# Patient Record
Sex: Female | Born: 1960
Health system: Southern US, Community
[De-identification: ages and names within clinical notes are randomized; demographics above are authoritative.]

## PROBLEM LIST (undated history)

## (undated) DIAGNOSIS — R112 Nausea with vomiting, unspecified: Secondary | ICD-10-CM

## (undated) DIAGNOSIS — Z9889 Other specified postprocedural states: Secondary | ICD-10-CM

## (undated) DIAGNOSIS — D649 Anemia, unspecified: Secondary | ICD-10-CM

## (undated) DIAGNOSIS — E538 Deficiency of other specified B group vitamins: Secondary | ICD-10-CM

## (undated) DIAGNOSIS — M1990 Unspecified osteoarthritis, unspecified site: Secondary | ICD-10-CM

## (undated) DIAGNOSIS — N200 Calculus of kidney: Secondary | ICD-10-CM

## (undated) HISTORY — PX: ANTERIOR FUSION CERVICAL SPINE: SUR626

## (undated) HISTORY — DX: Anemia, unspecified: D64.9

## (undated) HISTORY — PX: INCISION AND DRAINAGE / EXCISION THYROGLOSSAL CYST: SUR667

## (undated) HISTORY — PX: THYROGLOSSAL DUCT CYST: SHX297

---

## 2003-01-18 ENCOUNTER — Encounter: Admission: RE | Admit: 2003-01-18 | Discharge: 2003-04-18 | Payer: Self-pay | Admitting: Orthopedic Surgery

## 2004-08-22 ENCOUNTER — Ambulatory Visit: Payer: Self-pay | Admitting: Internal Medicine

## 2004-10-04 ENCOUNTER — Ambulatory Visit (HOSPITAL_COMMUNITY): Admission: RE | Admit: 2004-10-04 | Discharge: 2004-10-04 | Payer: Self-pay | Admitting: Neurosurgery

## 2004-11-09 ENCOUNTER — Ambulatory Visit (HOSPITAL_COMMUNITY): Admission: RE | Admit: 2004-11-09 | Discharge: 2004-11-10 | Payer: Self-pay | Admitting: Neurosurgery

## 2005-09-05 ENCOUNTER — Ambulatory Visit: Payer: Self-pay | Admitting: Internal Medicine

## 2012-10-01 HISTORY — PX: OTHER SURGICAL HISTORY: SHX169

## 2012-10-11 ENCOUNTER — Emergency Department (HOSPITAL_COMMUNITY)
Admission: EM | Admit: 2012-10-11 | Discharge: 2012-10-11 | Disposition: A | Discharge status: Home / Self Care | Payer: 59 | Attending: Emergency Medicine | Admitting: Emergency Medicine

## 2012-10-11 ENCOUNTER — Encounter (HOSPITAL_COMMUNITY): Payer: Self-pay | Admitting: Emergency Medicine

## 2012-10-11 DIAGNOSIS — L03011 Cellulitis of right finger: Secondary | ICD-10-CM

## 2012-10-11 DIAGNOSIS — IMO0002 Reserved for concepts with insufficient information to code with codable children: Secondary | ICD-10-CM | POA: Insufficient documentation

## 2012-10-11 MED ORDER — CLINDAMYCIN HCL 300 MG PO CAPS: 300.0000 mg | ORAL_CAPSULE | Freq: Four times a day (QID) | ORAL | Status: DC

## 2012-10-11 MED ORDER — HYDROCODONE-ACETAMINOPHEN 5-325 MG PO TABS: 1.0000 | ORAL_TABLET | Freq: Four times a day (QID) | ORAL | Status: DC | PRN

## 2012-10-11 MED ORDER — CLINDAMYCIN HCL 300 MG PO CAPS: 300.0000 mg | ORAL_CAPSULE | Freq: Four times a day (QID) | ORAL | Status: AC

## 2012-10-11 NOTE — Consult Note (Signed)
  See Dictation# [id number] Dominica Severin MD

## 2012-10-11 NOTE — ED Provider Notes (Signed)
CSN: [id number]     Arrival date & time 10/11/12  4098 History   First MD Initiated Contact with Patient 10/11/12 [phone]     Chief Complaint  Patient presents with  . Finger Injury   (Consider location/radiation/quality/duration/timing/severity/associated sxs/prior Treatment) Patient is a 52 y.o. female presenting with hand pain. The history is provided by the patient.  Hand Pain This is a new problem. Episode onset: 1 week ago. The problem occurs constantly. The problem has been gradually worsening. Associated symptoms comments: Started with pain of the right 3rd finger.  Then started to swell and has become increasingly more painful and swollen.  Start bactrim this week for infection but no help. Exacerbated by: palpation. Nothing relieves the symptoms. She has tried a warm compress (abx) for the symptoms. The treatment provided no relief.    No past medical history on file. No past surgical history on file. No family history on file. History  Substance Use Topics  . Smoking status: Not on file  . Smokeless tobacco: Not on file  . Alcohol Use: Not on file   OB History   No data available     Review of Systems  All other systems reviewed and are negative.    Allergies  Review of patient's allergies indicates not on file.  Home Medications  No current outpatient prescriptions on file. BP 123/69  Pulse 77  Temp(Src) 97.5 F (36.4 C) (Oral)  Resp 16  SpO2 97% Physical Exam  Nursing note and vitals reviewed. Constitutional: She is oriented to person, place, and time. She appears well-developed and well-nourished. No distress.  HENT:  Head: Normocephalic and atraumatic.  Mouth/Throat: Oropharynx is clear and moist.  Neck: Normal range of motion.  Cardiovascular: Normal rate and intact distal pulses.   Pulmonary/Chest: Effort normal.  Musculoskeletal: Normal range of motion. She exhibits no edema.       Right hand: She exhibits tenderness and swelling. She exhibits no  bony tenderness. Normal sensation noted. Normal strength noted.       Hands: Neurological: She is alert and oriented to person, place, and time.  Skin: Skin is warm and dry. No rash noted. No erythema.  Psychiatric: She has a normal mood and affect. Her behavior is normal.    ED Course  Procedures (including critical care time) Labs Review Labs Reviewed - No data to display Imaging Review No results found.  EKG Interpretation   None       MDM   1. Paronychia, right     Pt with finger complaints which appear to start as a paronychia however only worsening with septra an not draining.  Now swollen, tense and finger pad pain.  Concern for possible felon.  No drainage around the nail.  Dr. Amanda Pea with hand surgery here to see the pt.  7:49 AM Paronychia drained by Dr. Yisroel Ramming, MD 10/11/12 [phone]

## 2012-10-11 NOTE — ED Notes (Signed)
Gramig, MD at bedside.  

## 2012-10-11 NOTE — ED Notes (Signed)
Dr. Amanda Pea at bedside I &D right middle finger

## 2012-10-11 NOTE — ED Notes (Signed)
Pt is an OR nurse at [employer]. Pt reports that her symptoms started a week ago. Pt states that she was seen at a clinic, and was given an prescription for Septra, which she reports has not helped with the infection.

## 2012-10-12 NOTE — Op Note (Signed)
NAMECHIDINMA, CLITES              ACCOUNT NO.:  [id number]  MEDICAL RECORD NO.:  [id number]  LOCATION:  A09C                         FACILITY:  [facility]  PHYSICIAN:  Dionne Ano. Hagan Vanauken, M.D.DATE OF BIRTH:  12/11/1960  DATE OF PROCEDURE: DATE OF DISCHARGE:  10/11/2012                              OPERATIVE REPORT   Amanda Solomon is a 52 year old female who presents for evaluation of her right middle finger.  This patient has a history of right middle finger infection treatment over the last week with Bactrim DS.  This has gone on to worsen.  She presents for evaluation and treatment.  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  She now is working in Film/video editor.  PHYSICAL EXAMINATION:  GENERAL:  She is alert and oriented, in no acute distress. VITAL SIGNS:  Stable. CHEST:  Clear. EXTREMITIES:  Lower extremity examination is benign.  Left upper extremity examination is benign. ABDOMEN:  Nontender.  She is not short of breath. NEUROLOGIC:  She is lucid, alert and oriented x4.  Her right middle finger has an obvious infectious process with swelling and pain over the radial aspect of the paronychial region.  I have discussed these issues with her at length, and I would recommend I and D.  PROCEDURE NOTE:  The patient was taken to the procedure suite, underwent an intermetacarpal block by myself with lidocaine without epinephrine. Following this, she was prepped and draped in the usual sterile fashion with Betadine scrub and paint.  Following securing a sterile field, I then removed her nail plate about the most radial aspect.  I took a small radial sleever out and this was a partial nail plate removal.  Following this, I decompressed the deep abscess, I cultured this with aerobic and anaerobic culture.  Following this, I have discussed with the patient clindamycin as well as Norco to be used.  She  can use clindamycin 300 q.i.d., I am going to give her 14 days of this.  In addition to this, we are going to give her Norco dispensed #30.  I am going to see her back in the ER tomorrow so that we can go ahead and pull her packing and lavage her again as this is the most important part of her postoperative treatment (that is daily washings and irrigation to the area).  I am going to have her out of work until further notice that she works around patients.  I have discussed with her these notes, etc.  FINAL DIAGNOSIS:  Right middle finger abscess, status post I and D in the emergency department today.  It was a pleasure to see her.  Do's and don'ts discussed and all questions have been encouraged and answered.     Dionne Ano. Amanda Solomon, M.D.     [facility]  D:  10/11/2012  T:  10/11/2012  Job:  [id number]

## 2012-10-13 LAB — WOUND CULTURE

## 2012-10-16 LAB — ANAEROBIC CULTURE

## 2013-06-16 ENCOUNTER — Emergency Department: Payer: Self-pay | Admitting: Emergency Medicine

## 2013-06-18 ENCOUNTER — Encounter (INDEPENDENT_AMBULATORY_CARE_PROVIDER_SITE_OTHER): Payer: PRIVATE HEALTH INSURANCE | Admitting: Ophthalmology

## 2013-06-18 DIAGNOSIS — H43819 Vitreous degeneration, unspecified eye: Secondary | ICD-10-CM

## 2013-06-18 DIAGNOSIS — S0510XA Contusion of eyeball and orbital tissues, unspecified eye, initial encounter: Secondary | ICD-10-CM

## 2014-05-03 ENCOUNTER — Inpatient Hospital Stay: Admit: 2014-05-03 | Payer: Self-pay

## 2014-09-20 ENCOUNTER — Encounter: Payer: Self-pay | Admitting: Emergency Medicine

## 2014-09-20 ENCOUNTER — Emergency Department: Payer: 59

## 2014-09-20 ENCOUNTER — Other Ambulatory Visit: Payer: Self-pay

## 2014-09-20 ENCOUNTER — Emergency Department
Admission: EM | Admit: 2014-09-20 | Discharge: 2014-09-20 | Disposition: A | Discharge status: Home / Self Care | Payer: 59 | Attending: Student | Admitting: Student

## 2014-09-20 DIAGNOSIS — N201 Calculus of ureter: Secondary | ICD-10-CM | POA: Diagnosis not present

## 2014-09-20 DIAGNOSIS — R109 Unspecified abdominal pain: Secondary | ICD-10-CM | POA: Diagnosis present

## 2014-09-20 DIAGNOSIS — Z3202 Encounter for pregnancy test, result negative: Secondary | ICD-10-CM | POA: Diagnosis not present

## 2014-09-20 DIAGNOSIS — R52 Pain, unspecified: Secondary | ICD-10-CM

## 2014-09-20 LAB — URINALYSIS COMPLETE WITH MICROSCOPIC (ARMC ONLY)
Bilirubin Urine: NEGATIVE
Glucose, UA: NEGATIVE mg/dL
KETONES UR: NEGATIVE mg/dL
NITRITE: NEGATIVE
PH: 7 (ref 5.0–8.0)
PROTEIN: NEGATIVE mg/dL
SPECIFIC GRAVITY, URINE: 1.011 (ref 1.005–1.030)

## 2014-09-20 LAB — LIPASE, BLOOD: Lipase: 27 U/L (ref 22–51)

## 2014-09-20 LAB — CBC WITH DIFFERENTIAL/PLATELET
BASOS PCT: 1 %
Basophils Absolute: 0.1 10*3/uL (ref 0–0.1)
EOS ABS: 0.2 10*3/uL (ref 0–0.7)
Eosinophils Relative: 2 %
HCT: 35.2 % (ref 35.0–47.0)
HEMOGLOBIN: 11.4 g/dL — AB (ref 12.0–16.0)
Lymphocytes Relative: 23 %
Lymphs Abs: 2.1 10*3/uL (ref 1.0–3.6)
MCH: 26.9 pg (ref 26.0–34.0)
MCHC: 32.4 g/dL (ref 32.0–36.0)
MCV: 82.9 fL (ref 80.0–100.0)
MONOS PCT: 7 %
Monocytes Absolute: 0.7 10*3/uL (ref 0.2–0.9)
NEUTROS PCT: 67 %
Neutro Abs: 6.2 10*3/uL (ref 1.4–6.5)
PLATELETS: 335 10*3/uL (ref 150–440)
RBC: 4.25 MIL/uL (ref 3.80–5.20)
RDW: 13.3 % (ref 11.5–14.5)
WBC: 9.2 10*3/uL (ref 3.6–11.0)

## 2014-09-20 LAB — COMPREHENSIVE METABOLIC PANEL
ALBUMIN: 3.9 g/dL (ref 3.5–5.0)
ALK PHOS: 60 U/L (ref 38–126)
ALT: 21 U/L (ref 14–54)
ANION GAP: 8 (ref 5–15)
AST: 23 U/L (ref 15–41)
BUN: 9 mg/dL (ref 6–20)
CHLORIDE: 106 mmol/L (ref 101–111)
CO2: 24 mmol/L (ref 22–32)
Calcium: 9.1 mg/dL (ref 8.9–10.3)
Creatinine, Ser: 1.07 mg/dL — ABNORMAL HIGH (ref 0.44–1.00)
GFR calc Af Amer: >60 mL/min (ref >60)
GFR calc non Af Amer: 59 mL/min — ABNORMAL LOW (ref >60)
GLUCOSE: 99 mg/dL (ref 65–99)
POTASSIUM: 3.8 mmol/L (ref 3.5–5.1)
Sodium: 138 mmol/L (ref 135–145)
Total Bilirubin: 0.6 mg/dL (ref 0.3–1.2)
Total Protein: 6.9 g/dL (ref 6.5–8.1)

## 2014-09-20 LAB — POCT PREGNANCY, URINE: Preg Test, Ur: NEGATIVE

## 2014-09-20 MED ORDER — TAMSULOSIN HCL 0.4 MG PO CAPS: 0.4000 mg | ORAL_CAPSULE | Freq: Every day | ORAL | Status: DC

## 2014-09-20 MED ORDER — MORPHINE SULFATE (PF) 4 MG/ML IV SOLN
4.0000 mg | Freq: Once | INTRAVENOUS | Status: AC
  Administered 2014-09-20: 4 mg via INTRAVENOUS

## 2014-09-20 MED ORDER — ONDANSETRON 4 MG PO TBDP: 4.0000 mg | ORAL_TABLET | Freq: Three times a day (TID) | ORAL | Status: DC | PRN

## 2014-09-20 MED ORDER — OXYCODONE HCL 5 MG PO TABS: 5.0000 mg | ORAL_TABLET | Freq: Four times a day (QID) | ORAL | Status: DC | PRN

## 2014-09-20 MED ORDER — SODIUM CHLORIDE 0.9 % IV BOLUS (SEPSIS)
1000.0000 mL | Freq: Once | INTRAVENOUS | Status: AC
Start: 2014-09-20 — End: 2014-09-20
  Administered 2014-09-20: 1000 mL via INTRAVENOUS

## 2014-09-20 MED ORDER — ONDANSETRON HCL 4 MG/2ML IJ SOLN
4.0000 mg | Freq: Once | INTRAMUSCULAR | Status: AC
  Administered 2014-09-20: 4 mg via INTRAVENOUS

## 2014-09-20 MED ORDER — MORPHINE SULFATE (PF) 4 MG/ML IV SOLN
INTRAVENOUS | Status: AC
  Filled 2014-09-20: qty 1

## 2014-09-20 MED ORDER — KETOROLAC TROMETHAMINE 30 MG/ML IJ SOLN
30.0000 mg | Freq: Once | INTRAMUSCULAR | Status: AC
  Administered 2014-09-20: 30 mg via INTRAVENOUS
  Filled 2014-09-20: qty 1

## 2014-09-20 MED ORDER — ONDANSETRON HCL 4 MG/2ML IJ SOLN
INTRAMUSCULAR | Status: AC
  Administered 2014-09-20: 4 mg via INTRAVENOUS
  Filled 2014-09-20: qty 2

## 2014-09-20 NOTE — ED Provider Notes (Signed)
[facility] Emergency Department Provider Note  ____________________________________________  Time seen: Approximately 12:08 PM  I have reviewed the triage vital signs and the nursing notes.   HISTORY  Chief Complaint Flank Pain    HPI Amanda Solomon is a 54 y.o. female with no chronic medical problems presents for evaluation of sudden onset left flank pain that began earlier today, constant since onset, waxing and waning. No modifying factors. Currently her pain is severe. She has been severely nauseated but has not vomited. No diarrhea or fevers. No chest pain or difficulty breathing. She denies dysuria but is having urinary hesitancy. Currently she is menstruating.   History reviewed. No pertinent past medical history.  There are no active problems to display for this patient.   Past Surgical History  Procedure Laterality Date  . Anterior fusion cervical spine    . Thyroglossal duct cyst      No current outpatient prescriptions on file.  Allergies Review of patient's allergies indicates no known allergies.  History reviewed. No pertinent family history.  Social History Social History  Substance Use Topics  . Smoking status: Never Smoker   . Smokeless tobacco: Never Used  . Alcohol Use: No    Review of Systems Constitutional: No fever/chills Eyes: No visual changes. ENT: No sore throat. Cardiovascular: Denies chest pain. Respiratory: Denies shortness of breath. Gastrointestinal: No abdominal pain.  + nausea, no vomiting.  No diarrhea.  No constipation. Genitourinary: Positive for left flank pain. Musculoskeletal: Negative for back pain. Skin: Negative for rash. Neurological: Negative for headaches, focal weakness or numbness.  10-point ROS otherwise negative.  ____________________________________________   PHYSICAL EXAM:  VITAL SIGNS: ED Triage Vitals  Enc Vitals Group     BP 09/20/14 1134 121/73 mmHg     Pulse Rate 09/20/14  1134 78     Resp 09/20/14 1134 19     Temp 09/20/14 1134 97.9 F (36.6 C)     Temp Source 09/20/14 1134 Oral     SpO2 09/20/14 1134 100 %     Weight 09/20/14 1134 180 lb (81.647 kg)     Height 09/20/14 1134  (1.651 m)     Head Cir --      Peak Flow --      Pain Score 09/20/14 1137 8     Pain Loc --      Pain Edu? --      Excl. in GC? --     Constitutional: Alert and oriented. Appears nauseated, is in distress secondary to pain. Eyes: Conjunctivae are normal. PERRL. EOMI. Head: Atraumatic. Nose: No congestion/rhinnorhea. Mouth/Throat: Mucous membranes are moist.  Oropharynx non-erythematous. Neck: No stridor.  Cardiovascular: Normal rate, regular rhythm. Grossly normal heart sounds.  Good peripheral circulation. Respiratory: Normal respiratory effort.  No retractions. Lungs CTAB. Gastrointestinal: Soft and nontender. No distention. No abdominal bruits. + left CVA tenderness. Genitourinary: Deferred Musculoskeletal: No lower extremity tenderness nor edema.  No joint effusions. Neurologic:  Normal speech and language. No gross focal neurologic deficits are appreciated. No gait instability. Skin:  Skin is warm, dry and intact. No rash noted. Psychiatric: Mood and affect are normal. Speech and behavior are normal.  ____________________________________________   LABS (all labs ordered are listed, but only abnormal results are displayed)  Labs Reviewed  CBC WITH DIFFERENTIAL/PLATELET - Abnormal; Notable for the following:    Hemoglobin 11.4 (*)    All other components within normal limits  COMPREHENSIVE METABOLIC PANEL - Abnormal; Notable for the following:  Creatinine, Ser 1.07 (*)    GFR calc non Af Amer 59 (*)    All other components within normal limits  URINALYSIS COMPLETEWITH MICROSCOPIC ([facility] ONLY) - Abnormal; Notable for the following:    Color, Urine YELLOW (*)    APPearance CLEAR (*)    Hgb urine dipstick 2+ (*)    Leukocytes, UA 1+ (*)    Bacteria, UA MANY  (*)    Squamous Epithelial / LPF 0-5 (*)    All other components within normal limits  LIPASE, BLOOD  POC URINE PREG, ED  POCT PREGNANCY, URINE   ____________________________________________  EKG  ED ECG REPORT I, Gayla Doss, the attending physician, personally viewed and interpreted this ECG.   Date: 09/20/2014  EKG Time: 11:42  Rate: 83  Rhythm: normal EKG, normal sinus rhythm  Axis: normal  Intervals:none  ST&T Change: No acute ST change.  ____________________________________________  RADIOLOGY  CT abdomen and pelvis IMPRESSION: 4 mm stone at the left ureterovesical junction. Mild dilatation of the left ureter.  Nonobstructive right kidney stone.  Small pulmonary nodules at the left lung base, largest measuring 4 mm. If the patient is at high risk for bronchogenic carcinoma, follow-up chest CT at 1 year is recommended. If the patient is at low risk, no follow-up is needed. This recommendation follows the consensus statement: Guidelines for Management of Small Pulmonary Nodules Detected on CT Scans: A Statement from the Fleischner Society as published in Radiology 2005; 237:395-400. ____________________________________________   PROCEDURES  Procedure(s) performed: None  Critical Care performed: No  ____________________________________________   INITIAL IMPRESSION / ASSESSMENT AND PLAN / ED COURSE  Pertinent labs & imaging results that were available during my care of the patient were reviewed by me and considered in my medical decision making (see chart for details).  Amanda Solomon is a 54 y.o. female with no chronic medical problems presents for evaluation of sudden onset left flank pain that began earlier today. On exam, she is in distress second to pain. Vital signs stable, she is afebrile. She does have left flank/CVA tenderness. Concern for possible ascending urinary tract infection versus nephrolithiasis. Plan for screening labs, urinalysis, urine  pregnancy likely CT of the abdomen and pelvis. We'll provide antiemetics and pain control. Reassess for disposition.  ----------------------------------------- 4:00 PM on 09/20/2014 ----------------------------------------- Labs notable for very mild creatinine elevation at 1.07. Mild anemia with hemoglobin 11.4. Urinalysis with multiple red blood cells and white blood cells, nitrite negative, no fever, no leukocytosis not consistent with an infected stone. Negative urine pregnancy test. CT of the abdomen and pelvis confirms 4 mm left UVJ stone. At this point, patient's pain has improved significantly. She appears much more comfortable. Discussed discharge with expectant management and urology follow-up. We also discussed the pulmonary nodule on her CT scan and need for outpatient follow-up. She voices understanding and is comfortable with the discharge plan.  ____________________________________________   FINAL CLINICAL IMPRESSION(S) / ED DIAGNOSES  Final diagnoses:  Acute flank pain  Pain  Ureterolithiasis      Gayla Doss, MD 09/20/14 [phone]

## 2014-09-20 NOTE — ED Notes (Signed)
Pt reports left side flank pain and nausea. Denies blood in urine. Pt reports urgency but little urine output.

## 2014-09-21 ENCOUNTER — Encounter (HOSPITAL_COMMUNITY): Payer: Self-pay | Admitting: Emergency Medicine

## 2014-10-02 DIAGNOSIS — N200 Calculus of kidney: Secondary | ICD-10-CM | POA: Insufficient documentation

## 2014-11-02 DIAGNOSIS — R911 Solitary pulmonary nodule: Secondary | ICD-10-CM | POA: Insufficient documentation

## 2014-11-03 ENCOUNTER — Other Ambulatory Visit: Payer: Self-pay | Admitting: Internal Medicine

## 2014-11-03 DIAGNOSIS — Z1231 Encounter for screening mammogram for malignant neoplasm of breast: Secondary | ICD-10-CM

## 2014-11-15 ENCOUNTER — Other Ambulatory Visit: Payer: Self-pay | Admitting: Internal Medicine

## 2014-11-15 ENCOUNTER — Ambulatory Visit
Admission: RE | Admit: 2014-11-15 | Discharge: 2014-11-15 | Disposition: A | Discharge status: Home / Self Care | Payer: 59 | Source: Ambulatory Visit | Attending: Internal Medicine | Admitting: Internal Medicine

## 2014-11-15 DIAGNOSIS — Z1231 Encounter for screening mammogram for malignant neoplasm of breast: Secondary | ICD-10-CM | POA: Insufficient documentation

## 2014-11-17 ENCOUNTER — Other Ambulatory Visit: Payer: Self-pay | Admitting: Internal Medicine

## 2014-11-17 DIAGNOSIS — R928 Other abnormal and inconclusive findings on diagnostic imaging of breast: Secondary | ICD-10-CM

## 2014-11-19 DIAGNOSIS — E538 Deficiency of other specified B group vitamins: Secondary | ICD-10-CM | POA: Insufficient documentation

## 2014-11-19 DIAGNOSIS — D500 Iron deficiency anemia secondary to blood loss (chronic): Secondary | ICD-10-CM | POA: Insufficient documentation

## 2014-12-06 ENCOUNTER — Ambulatory Visit
Admission: RE | Admit: 2014-12-06 | Discharge: 2014-12-06 | Disposition: A | Discharge status: Home / Self Care | Payer: 59 | Source: Ambulatory Visit | Attending: Internal Medicine | Admitting: Internal Medicine

## 2014-12-06 DIAGNOSIS — R928 Other abnormal and inconclusive findings on diagnostic imaging of breast: Secondary | ICD-10-CM | POA: Insufficient documentation

## 2015-01-12 DIAGNOSIS — E538 Deficiency of other specified B group vitamins: Secondary | ICD-10-CM | POA: Diagnosis not present

## 2015-01-12 DIAGNOSIS — K640 First degree hemorrhoids: Secondary | ICD-10-CM | POA: Diagnosis not present

## 2015-01-12 DIAGNOSIS — K2980 Duodenitis without bleeding: Secondary | ICD-10-CM | POA: Diagnosis not present

## 2015-01-12 DIAGNOSIS — Z79899 Other long term (current) drug therapy: Secondary | ICD-10-CM | POA: Diagnosis not present

## 2015-01-12 DIAGNOSIS — Z1211 Encounter for screening for malignant neoplasm of colon: Secondary | ICD-10-CM | POA: Diagnosis not present

## 2015-01-12 DIAGNOSIS — D509 Iron deficiency anemia, unspecified: Secondary | ICD-10-CM | POA: Diagnosis not present

## 2015-01-12 DIAGNOSIS — K2950 Unspecified chronic gastritis without bleeding: Secondary | ICD-10-CM | POA: Diagnosis not present

## 2015-01-13 ENCOUNTER — Encounter: Payer: Self-pay | Admitting: *Deleted

## 2015-01-13 DIAGNOSIS — N939 Abnormal uterine and vaginal bleeding, unspecified: Secondary | ICD-10-CM | POA: Diagnosis not present

## 2015-01-13 DIAGNOSIS — N938 Other specified abnormal uterine and vaginal bleeding: Secondary | ICD-10-CM | POA: Diagnosis not present

## 2015-01-14 ENCOUNTER — Ambulatory Visit: Payer: 59 | Admitting: Certified Registered Nurse Anesthetist

## 2015-01-14 ENCOUNTER — Ambulatory Visit
Admission: RE | Admit: 2015-01-14 | Discharge: 2015-01-14 | Disposition: A | Discharge status: Home / Self Care | Payer: 59 | Source: Ambulatory Visit | Attending: Unknown Physician Specialty | Admitting: Unknown Physician Specialty

## 2015-01-14 ENCOUNTER — Encounter
Admission: RE | Disposition: A | Discharge status: Home / Self Care | Payer: Self-pay | Source: Ambulatory Visit | Attending: Unknown Physician Specialty

## 2015-01-14 DIAGNOSIS — K2970 Gastritis, unspecified, without bleeding: Secondary | ICD-10-CM | POA: Diagnosis not present

## 2015-01-14 DIAGNOSIS — K259 Gastric ulcer, unspecified as acute or chronic, without hemorrhage or perforation: Secondary | ICD-10-CM | POA: Diagnosis not present

## 2015-01-14 DIAGNOSIS — D509 Iron deficiency anemia, unspecified: Secondary | ICD-10-CM | POA: Insufficient documentation

## 2015-01-14 DIAGNOSIS — Z1211 Encounter for screening for malignant neoplasm of colon: Secondary | ICD-10-CM | POA: Diagnosis not present

## 2015-01-14 DIAGNOSIS — Z79899 Other long term (current) drug therapy: Secondary | ICD-10-CM | POA: Insufficient documentation

## 2015-01-14 DIAGNOSIS — K2950 Unspecified chronic gastritis without bleeding: Secondary | ICD-10-CM | POA: Insufficient documentation

## 2015-01-14 DIAGNOSIS — K648 Other hemorrhoids: Secondary | ICD-10-CM | POA: Diagnosis not present

## 2015-01-14 DIAGNOSIS — K2980 Duodenitis without bleeding: Secondary | ICD-10-CM | POA: Insufficient documentation

## 2015-01-14 DIAGNOSIS — K3189 Other diseases of stomach and duodenum: Secondary | ICD-10-CM | POA: Diagnosis not present

## 2015-01-14 DIAGNOSIS — E538 Deficiency of other specified B group vitamins: Secondary | ICD-10-CM | POA: Insufficient documentation

## 2015-01-14 DIAGNOSIS — K640 First degree hemorrhoids: Secondary | ICD-10-CM | POA: Insufficient documentation

## 2015-01-14 DIAGNOSIS — K2990 Gastroduodenitis, unspecified, without bleeding: Secondary | ICD-10-CM | POA: Diagnosis not present

## 2015-01-14 HISTORY — DX: Other specified postprocedural states: Z98.890

## 2015-01-14 HISTORY — PX: COLONOSCOPY WITH PROPOFOL: SHX5780

## 2015-01-14 HISTORY — DX: Deficiency of other specified B group vitamins: E53.8

## 2015-01-14 HISTORY — DX: Calculus of kidney: N20.0

## 2015-01-14 HISTORY — DX: Unspecified osteoarthritis, unspecified site: M19.90

## 2015-01-14 HISTORY — PX: ESOPHAGOGASTRODUODENOSCOPY (EGD) WITH PROPOFOL: SHX5813

## 2015-01-14 HISTORY — DX: Nausea with vomiting, unspecified: R11.2

## 2015-01-14 SURGERY — COLONOSCOPY WITH PROPOFOL
Anesthesia: General

## 2015-01-14 MED ORDER — SODIUM CHLORIDE 0.9 % IV SOLN
INTRAVENOUS | Status: DC
  Administered 2015-01-14: 1000 mL via INTRAVENOUS

## 2015-01-14 MED ORDER — FENTANYL CITRATE (PF) 100 MCG/2ML IJ SOLN
INTRAMUSCULAR | Status: DC | PRN
  Administered 2015-01-14: 50 ug via INTRAVENOUS

## 2015-01-14 MED ORDER — SODIUM CHLORIDE 0.9 % IV SOLN: INTRAVENOUS | Status: DC

## 2015-01-14 MED ORDER — LIDOCAINE HCL (CARDIAC) 20 MG/ML IV SOLN
INTRAVENOUS | Status: DC | PRN
  Administered 2015-01-14: 100 mg via INTRAVENOUS

## 2015-01-14 MED ORDER — PROPOFOL 500 MG/50ML IV EMUL
INTRAVENOUS | Status: DC | PRN
  Administered 2015-01-14: 120 ug/kg/min via INTRAVENOUS

## 2015-01-14 MED ORDER — GLYCOPYRROLATE 0.2 MG/ML IJ SOLN
INTRAMUSCULAR | Status: DC | PRN
  Administered 2015-01-14: 0.2 mg via INTRAVENOUS

## 2015-01-14 MED ORDER — PROPOFOL 10 MG/ML IV BOLUS
INTRAVENOUS | Status: DC | PRN
  Administered 2015-01-14: 50 mg via INTRAVENOUS

## 2015-01-14 MED ORDER — ONDANSETRON HCL 4 MG/2ML IJ SOLN
INTRAMUSCULAR | Status: DC | PRN
  Administered 2015-01-14: 4 mg via INTRAVENOUS

## 2015-01-14 NOTE — Op Note (Signed)
[facility] Gastroenterology Patient Name: Rocco SereneCharlotte Solomon Procedure Date: 01/14/2015 8:14 AM MRN: [id number] Account #: [id number] Date of Birth: Jun 03, 1960 Admit Type: Outpatient Age: [age] Room: [facility] ENDO ROOM 1 Gender: Female Note Status: Finalized Procedure:         Colonoscopy Indications:       Screening for colorectal malignant neoplasm Providers:         Scot Junobert T. Zuleima Haser, MD Referring MD:      Daniel NonesBert Klein, MD (Referring MD) Medicines:         Propofol per Anesthesia Complications:     No immediate complications. Procedure:         Pre-Anesthesia Assessment:                    - After reviewing the risks and benefits, the patient was                     deemed in satisfactory condition to undergo the procedure.                    After obtaining informed consent, the colonoscope was                     passed under direct vision. Throughout the procedure, the                     patient's blood pressure, pulse, and oxygen saturations                     were monitored continuously. The Colonoscope was                     introduced through the anus and advanced to the the cecum,                     identified by appendiceal orifice and ileocecal valve. The                     colonoscopy was performed without difficulty. The patient                     tolerated the procedure well. The quality of the bowel                     preparation was good. Findings:      Internal hemorrhoids were found during endoscopy. The hemorrhoids were       small and Grade I (internal hemorrhoids that do not prolapse).      The exam was otherwise without abnormality. Impression:        - Internal hemorrhoids.                    - The examination was otherwise normal.                    - No specimens collected. Recommendation:    - Repeat colonoscopy in 10 years for screening purposes. Scot Junobert T Estephany Perot, MD 01/14/2015 8:49:42 AM This report has been signed  electronically. Number of Addenda: 0 Note Initiated On: 01/14/2015 8:14 AM Scope Withdrawal Time: 0 hours 6 minutes 20 seconds  Total Procedure Duration: 0 hours 13 minutes 54 seconds       [facility]

## 2015-01-14 NOTE — Op Note (Signed)
[facility] Gastroenterology Patient Name: Amanda Solomon Procedure Date: 01/14/2015 8:15 AM MRN: [id number] Account #: [id number] Date of Birth: 12-25-1960 Admit Type: Outpatient Age: 55 Room: [facility] ENDO ROOM 1 Gender: Female Note Status: Finalized Procedure:         Upper GI endoscopy Indications:       Unexplained iron deficiency anemia Providers:         Scot Jun, MD Referring MD:      Daniel Nones, MD (Referring MD) Medicines:         Propofol per Anesthesia Complications:     No immediate complications. Procedure:         Pre-Anesthesia Assessment:                    - After reviewing the risks and benefits, the patient was                     deemed in satisfactory condition to undergo the procedure.                    After obtaining informed consent, the endoscope was passed                     under direct vision. Throughout the procedure, the                     patient's blood pressure, pulse, and oxygen saturations                     were monitored continuously. The Endoscope was introduced                     through the mouth, and advanced to the second part of                     duodenum. The upper GI endoscopy was accomplished without                     difficulty. The patient tolerated the procedure well. Findings:      LA Grade A (one or more mucosal breaks less than 5 mm, not extending       between tops of 2 mucosal folds) esophagitis with no bleeding was found       39 cm from the incisors. Biopsies were taken with a cold forceps for       histology.      Localized moderate inflammation characterized by erythema and       granularity was found on the anterior wall of the gastric antrum.       Biopsies were taken with a cold forceps for histology. Biopsies were       taken with a cold forceps for Helicobacter pylori testing.      One non-bleeding cratered gastric ulcer with no stigmata of bleeding was       found on the lesser  curvature of the gastric antrum. The lesion was 7 mm       in largest dimension. Biopsies were taken with a cold forceps for       histology. Biopsies were taken with a cold forceps for Helicobacter       pylori testing.      Patchy mild inflammation characterized by erythema and granularity was       found in the duodenal bulb. Biopsies for histology were taken  with a       cold forceps for for evaluation of celiac disease. Biopsies were taken       with a cold forceps for histology.      The 2nd part of the duodenum was normal. Biopsies for histology were       taken with a cold forceps for for evaluation of celiac disease. Impression:        - LA Grade A reflux esophagitis. Rule out Barrett's                     esophagus. Biopsied.                    - Gastritis. Biopsied.                    - Gastric ulcer with clean base. Biopsied.                    - Duodenitis. Biopsied.                    - Normal 2nd part of the duodenum. Biopsied. Recommendation:    - Await pathology results. Scot Junobert T [name], MD 01/14/2015 8:31:44 AM This report has been signed electronically. Number of Addenda: 0 Note Initiated On: 01/14/2015 8:15 AM      [facility]

## 2015-01-14 NOTE — Anesthesia Preprocedure Evaluation (Addendum)
Anesthesia Evaluation  Patient identified by MRN, date of birth, ID band Patient awake    Reviewed: Allergy & Precautions, H&P , NPO status , Patient's Chart, lab work & pertinent test results, reviewed documented beta blocker date and time   History of Anesthesia Complications (+) PONV and history of anesthetic complications  Airway Mallampati: II  TM Distance: >3 FB Neck ROM: full    Dental no notable dental hx.    Pulmonary neg pulmonary ROS,    Pulmonary exam normal breath sounds clear to auscultation       Cardiovascular Exercise Tolerance: Good negative cardio ROS   Rhythm:regular Rate:Normal     Neuro/Psych negative neurological ROS  negative psych ROS   GI/Hepatic negative GI ROS, Neg liver ROS,   Endo/Other  negative endocrine ROS  Renal/GU Renal diseasenegative Renal ROS  negative genitourinary   Musculoskeletal   Abdominal   Peds  Hematology negative hematology ROS (+)   Anesthesia Other Findings   Reproductive/Obstetrics negative OB ROS                             Anesthesia Physical Anesthesia Plan  ASA: II  Anesthesia Plan: General   Post-op Pain Management:    Induction:   Airway Management Planned:   Additional Equipment:   Intra-op Plan:   Post-operative Plan:   Informed Consent: I have reviewed the patients History and Physical, chart, labs and discussed the procedure including the risks, benefits and alternatives for the proposed anesthesia with the patient or authorized representative who has indicated his/her understanding and acceptance.   Dental Advisory Given  Plan Discussed with: CRNA  Anesthesia Plan Comments:         Anesthesia Quick Evaluation

## 2015-01-14 NOTE — Anesthesia Postprocedure Evaluation (Signed)
Anesthesia Post Note  Patient: Amanda GimenezCharlotte A Guthmiller  Procedure(s) Performed: Procedure(s) (LRB): COLONOSCOPY WITH PROPOFOL (N/A) ESOPHAGOGASTRODUODENOSCOPY (EGD) WITH PROPOFOL (N/A)  Patient location during evaluation: PACU Anesthesia Type: General Level of consciousness: awake and alert and oriented Pain management: pain level controlled Vital Signs Assessment: post-procedure vital signs reviewed and stable Respiratory status: spontaneous breathing Cardiovascular status: blood pressure returned to baseline Anesthetic complications: no    Last Vitals:  Filed Vitals:   01/14/15 0726 01/14/15 0852  BP: 131/59 112/48  Pulse: 70 71  Temp: 36.9 C 36.2 C  Resp: 18 14    Last Pain:  Filed Vitals:   01/14/15 0853  PainSc: 0-No pain                 Diante Barley

## 2015-01-14 NOTE — Anesthesia Procedure Notes (Signed)
Performed by: Piccola Arico Pre-anesthesia Checklist: Patient identified, Emergency Drugs available, Suction available, Patient being monitored and Timeout performed Patient Re-evaluated:Patient Re-evaluated prior to inductionOxygen Delivery Method: Nasal cannula Intubation Type: IV induction       

## 2015-01-14 NOTE — H&P (Signed)
Primary Care Physician:  Lynnea FerrierBERT J KLEIN III, MD Primary Gastroenterologist:  Dr. Mechele CollinElliott  Pre-Procedure History & Physical: HPI:  Amanda Solomon is a 55 y.o. female is here for an endoscopy and colonoscopy.   Past Medical History  Diagnosis Date  . B12 deficiency   . Nephrolithiasis   . Osteoarthritis   . PONV (postoperative nausea and vomiting)     Past Surgical History  Procedure Laterality Date  . Anterior fusion cervical spine    . Thyroglossal duct cyst    . Incision and drainage / excision thyroglossal cyst    . Incision and drainage  10/2012    Finger abscess    Prior to Admission medications   Medication Sig Start Date End Date Taking? Authorizing Provider  HYDROcodone-acetaminophen (NORCO/VICODIN) 5-325 MG per tablet Take 1-2 tablets by mouth every 6 (six) hours as needed for pain. 10/11/12   Gwyneth SproutWhitney Plunkett, MD  ibuprofen (ADVIL,MOTRIN) 200 MG tablet Take 400 mg by mouth every 6 (six) hours as needed for pain.    Historical Provider, MD  ondansetron (ZOFRAN ODT) 4 MG disintegrating tablet Take 1 tablet (4 mg total) by mouth every 8 (eight) hours as needed for nausea or vomiting. 09/20/14   Gayla DossEryka A Gayle, MD  oxyCODONE (ROXICODONE) 5 MG immediate release tablet Take 1 tablet (5 mg total) by mouth every 6 (six) hours as needed for moderate pain. Do not drive while taking this medication. 09/20/14   Gayla DossEryka A Gayle, MD  sulfamethoxazole-trimethoprim (BACTRIM DS) 800-160 MG per tablet Take 1 tablet by mouth 2 (two) times daily.    Historical Provider, MD  tamsulosin (FLOMAX) 0.4 MG CAPS capsule Take 1 capsule (0.4 mg total) by mouth daily. 09/20/14   Gayla DossEryka A Gayle, MD    Allergies as of 12/09/2014  . (No Known Allergies)    Family History  Problem Relation Age of Onset  . Breast cancer Maternal Aunt   . Breast cancer Maternal Grandmother     Social History   Social History  . Marital Status: Divorced    Spouse Name: N/A  . Number of Children: N/A  . Years of  Education: N/A   Occupational History  . Not on file.   Social History Main Topics  . Smoking status: Never Smoker   . Smokeless tobacco: Never Used  . Alcohol Use: No  . Drug Use: No  . Sexual Activity: Not on file   Other Topics Concern  . Not on file   Social History Narrative   ** Merged History Encounter **        Review of Systems: See HPI, otherwise negative ROS  Physical Exam: BP 131/59 mmHg  Pulse 70  Temp(Src) 98.5 F (36.9 C) (Tympanic)  Resp 18  Ht 5\' 5"  (1.651 m)  Wt 86.183 kg (190 lb)  BMI 31.62 kg/m2  SpO2 100% General:   Alert,  pleasant and cooperative in NAD Head:  Normocephalic and atraumatic. Neck:  Supple; no masses or thyromegaly. Lungs:  Clear throughout to auscultation.    Heart:  Regular rate and rhythm. Abdomen:  Soft, nontender and nondistended. Normal bowel sounds, without guarding, and without rebound.   Neurologic:  Alert and  oriented x4;  grossly normal neurologically.  Impression/Plan: Amanda Solomon is here for an endoscopy and colonoscopy to be performed for colon screening and iron def anemia  Risks, benefits, limitations, and alternatives regarding  endoscopy and colonoscopy have been reviewed with the patient.  Questions have been answered.  All parties agreeable.   Lynnae Prude, MD  01/14/2015, 8:13 AM

## 2015-01-14 NOTE — Transfer of Care (Signed)
Immediate Anesthesia Transfer of Care Note  Patient: Amanda Solomon  Procedure(s) Performed: Procedure(s): COLONOSCOPY WITH PROPOFOL (N/A) ESOPHAGOGASTRODUODENOSCOPY (EGD) WITH PROPOFOL (N/A)  Patient Location: PACU  Anesthesia Type:General  Level of Consciousness: awake, alert  and oriented  Airway & Oxygen Therapy: Patient Spontanous Breathing and Patient connected to nasal cannula oxygen  Post-op Assessment: Report given to RN and Post -op Vital signs reviewed and stable  Post vital signs: Reviewed and stable  Last Vitals:  Filed Vitals:   01/14/15 0726 01/14/15 0852  BP: 131/59 112/48  Pulse: 70 71  Temp: 36.9 C 36.2 C  Resp: 18 14    Complications: No apparent anesthesia complications

## 2015-01-19 ENCOUNTER — Encounter: Payer: Self-pay | Admitting: Unknown Physician Specialty

## 2015-01-19 DIAGNOSIS — Z8711 Personal history of peptic ulcer disease: Secondary | ICD-10-CM | POA: Insufficient documentation

## 2015-01-19 DIAGNOSIS — Z8719 Personal history of other diseases of the digestive system: Secondary | ICD-10-CM | POA: Insufficient documentation

## 2015-01-21 LAB — SURGICAL PATHOLOGY

## 2015-04-28 ENCOUNTER — Encounter: Payer: Self-pay | Admitting: *Deleted

## 2015-04-28 DIAGNOSIS — K31811 Angiodysplasia of stomach and duodenum with bleeding: Secondary | ICD-10-CM | POA: Diagnosis not present

## 2015-04-28 DIAGNOSIS — Z803 Family history of malignant neoplasm of breast: Secondary | ICD-10-CM | POA: Diagnosis not present

## 2015-04-28 DIAGNOSIS — M1990 Unspecified osteoarthritis, unspecified site: Secondary | ICD-10-CM | POA: Diagnosis not present

## 2015-04-28 DIAGNOSIS — Z87442 Personal history of urinary calculi: Secondary | ICD-10-CM | POA: Diagnosis not present

## 2015-04-28 DIAGNOSIS — E538 Deficiency of other specified B group vitamins: Secondary | ICD-10-CM | POA: Diagnosis not present

## 2015-04-28 DIAGNOSIS — Z79899 Other long term (current) drug therapy: Secondary | ICD-10-CM | POA: Diagnosis not present

## 2015-04-28 DIAGNOSIS — Z8711 Personal history of peptic ulcer disease: Secondary | ICD-10-CM | POA: Diagnosis present

## 2015-04-29 ENCOUNTER — Encounter
Admission: RE | Disposition: A | Discharge status: Home / Self Care | Payer: Self-pay | Source: Ambulatory Visit | Attending: Unknown Physician Specialty

## 2015-04-29 ENCOUNTER — Ambulatory Visit
Admission: RE | Admit: 2015-04-29 | Discharge: 2015-04-29 | Disposition: A | Discharge status: Home / Self Care | Payer: 59 | Source: Ambulatory Visit | Attending: Unknown Physician Specialty | Admitting: Unknown Physician Specialty

## 2015-04-29 ENCOUNTER — Ambulatory Visit: Payer: 59 | Admitting: Certified Registered Nurse Anesthetist

## 2015-04-29 ENCOUNTER — Encounter: Payer: Self-pay | Admitting: *Deleted

## 2015-04-29 DIAGNOSIS — K5520 Angiodysplasia of colon without hemorrhage: Secondary | ICD-10-CM | POA: Diagnosis not present

## 2015-04-29 DIAGNOSIS — K449 Diaphragmatic hernia without obstruction or gangrene: Secondary | ICD-10-CM | POA: Diagnosis not present

## 2015-04-29 DIAGNOSIS — Z803 Family history of malignant neoplasm of breast: Secondary | ICD-10-CM | POA: Insufficient documentation

## 2015-04-29 DIAGNOSIS — M1990 Unspecified osteoarthritis, unspecified site: Secondary | ICD-10-CM | POA: Insufficient documentation

## 2015-04-29 DIAGNOSIS — K31811 Angiodysplasia of stomach and duodenum with bleeding: Secondary | ICD-10-CM | POA: Insufficient documentation

## 2015-04-29 DIAGNOSIS — K31819 Angiodysplasia of stomach and duodenum without bleeding: Secondary | ICD-10-CM | POA: Diagnosis not present

## 2015-04-29 DIAGNOSIS — Z87442 Personal history of urinary calculi: Secondary | ICD-10-CM | POA: Diagnosis not present

## 2015-04-29 DIAGNOSIS — E538 Deficiency of other specified B group vitamins: Secondary | ICD-10-CM | POA: Diagnosis not present

## 2015-04-29 DIAGNOSIS — Z79899 Other long term (current) drug therapy: Secondary | ICD-10-CM | POA: Insufficient documentation

## 2015-04-29 HISTORY — PX: ESOPHAGOGASTRODUODENOSCOPY (EGD) WITH PROPOFOL: SHX5813

## 2015-04-29 HISTORY — DX: Calculus of kidney: N20.0

## 2015-04-29 SURGERY — ESOPHAGOGASTRODUODENOSCOPY (EGD) WITH PROPOFOL
Anesthesia: General

## 2015-04-29 MED ORDER — SODIUM CHLORIDE 0.9 % IV SOLN: INTRAVENOUS | Status: DC

## 2015-04-29 MED ORDER — PROPOFOL 10 MG/ML IV BOLUS
INTRAVENOUS | Status: DC | PRN
  Administered 2015-04-29: 10 mg via INTRAVENOUS
  Administered 2015-04-29: 50 mg via INTRAVENOUS
  Administered 2015-04-29: 20 mg via INTRAVENOUS

## 2015-04-29 MED ORDER — ONDANSETRON HCL 4 MG/2ML IJ SOLN
INTRAMUSCULAR | Status: DC | PRN
  Administered 2015-04-29: 4 mg via INTRAVENOUS

## 2015-04-29 MED ORDER — SODIUM CHLORIDE 0.9 % IV SOLN
INTRAVENOUS | Status: DC
  Administered 2015-04-29: 1000 mL via INTRAVENOUS

## 2015-04-29 MED ORDER — GLYCOPYRROLATE 0.2 MG/ML IJ SOLN
INTRAMUSCULAR | Status: DC | PRN
Start: 2015-04-29 — End: 2015-04-29
  Administered 2015-04-29: 0.2 mg via INTRAVENOUS

## 2015-04-29 MED ORDER — LIDOCAINE HCL (CARDIAC) 20 MG/ML IV SOLN
INTRAVENOUS | Status: DC | PRN
  Administered 2015-04-29: 100 mg via INTRAVENOUS

## 2015-04-29 MED ORDER — PROPOFOL 500 MG/50ML IV EMUL
INTRAVENOUS | Status: DC | PRN
  Administered 2015-04-29: 120 ug/kg/min via INTRAVENOUS

## 2015-04-29 MED ORDER — FENTANYL CITRATE (PF) 100 MCG/2ML IJ SOLN
INTRAMUSCULAR | Status: DC | PRN
  Administered 2015-04-29: 50 ug via INTRAVENOUS

## 2015-04-29 NOTE — Op Note (Signed)
[facility] Gastroenterology Patient Name: Rocco SereneCharlotte Christiana Procedure Date: 04/29/2015 7:32 AM MRN: [id number] Account #: [id number] Date of Birth: 04-Jul-1960 Admit Type: Outpatient Age: [age] Room[facility] ROOM 1 Gender: Female Note Status: Finalized Procedure:            Upper GI endoscopy Indications:          Follow-up of acute gastric ulcer Providers:            Scot Junobert T. Elliott, MD Referring MD:         Daniel NonesBert Klein, MD (Referring MD) Medicines:            Propofol per Anesthesia Complications:        No immediate complications. Procedure:            Pre-Anesthesia Assessment:                       - After reviewing the risks and benefits, the patient                        was deemed in satisfactory condition to undergo the                        procedure.                       After obtaining informed consent, the endoscope was                        passed under direct vision. Throughout the procedure,                        the patient's blood pressure, pulse, and oxygen                        saturations were monitored continuously. The Endoscope                        was introduced through the mouth, and advanced to the                        second part of duodenum. The upper GI endoscopy was                        accomplished without difficulty. The patient tolerated                        the procedure well. Findings:      The examined esophagus was normal. A small hiatal hernia seen.      A single diminutive bleeding angioectasia was found in the gastric       antrum. Fresh blood washed off and active oozing seen. Possible       Dieulafoy lesion. Coagulation for hemostasis using bipolar probe was       successful. To prevent bleeding post-maneuver, two hemostatic clips were       successfully placed. There was no bleeding at the end of the procedure.      The examined duodenum was normal. Impression:           - Normal esophagus.       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  Treated with bipolar cautery. Clips were placed.                       - Normal examined duodenum.                       - No specimens collected. Recommendation:       - The findings and recommendations were discussed with                        the patient's family. Scot Jun, MD 04/29/2015 7:58:12 AM This report has been signed electronically. Number of Addenda: 0 Note Initiated On: 04/29/2015 7:32 AM      [facility]

## 2015-04-29 NOTE — Anesthesia Postprocedure Evaluation (Signed)
Anesthesia Post Note  Patient: Amanda Solomon  Procedure(s) Performed: Procedure(s) (LRB): ESOPHAGOGASTRODUODENOSCOPY (EGD) WITH PROPOFOL (N/A)  Patient location during evaluation: Endoscopy Anesthesia Type: General Level of consciousness: awake and alert Pain management: pain level controlled Vital Signs Assessment: post-procedure vital signs reviewed and stable Respiratory status: spontaneous breathing, nonlabored ventilation, respiratory function stable and patient connected to nasal cannula oxygen Cardiovascular status: blood pressure returned to baseline and stable Postop Assessment: no signs of nausea or vomiting Anesthetic complications: no    Last Vitals:  Filed Vitals:   04/29/15 0818 04/29/15 0828  BP: 106/63 104/62  Pulse: 70 62  Temp:    Resp: 18 17    Last Pain: There were no vitals filed for this visit.               Lenard SimmerAndrew Cola Gane

## 2015-04-29 NOTE — Anesthesia Procedure Notes (Signed)
Performed by: Malva CoganBEANE, Devarious Pavek Pre-anesthesia Checklist: Emergency Drugs available, Suction available, Patient identified, Patient being monitored and Timeout performed Patient Re-evaluated:Patient Re-evaluated prior to inductionOxygen Delivery Method: Nasal cannula Intubation Type: IV induction

## 2015-04-29 NOTE — H&P (Signed)
Primary Care Physician:  Lynnea FerrierBERT J KLEIN III, MD Primary Gastroenterologist:  Dr. Mechele CollinElliott  Pre-Procedure History & Physical: HPI:  Amanda GimenezCharlotte A Solomon is a 55 y.o. female is here for an endoscopy.   Past Medical History  Diagnosis Date  . B12 deficiency   . Nephrolithiasis   . Osteoarthritis   . PONV (postoperative nausea and vomiting)   . Kidney stone     Past Surgical History  Procedure Laterality Date  . Anterior fusion cervical spine    . Thyroglossal duct cyst    . Incision and drainage / excision thyroglossal cyst    . Incision and drainage  10/2012    Finger abscess  . Colonoscopy with propofol N/A 01/14/2015    Procedure: COLONOSCOPY WITH PROPOFOL;  Surgeon: Scot Junobert T Fahad Cisse, MD;  [facility];  Service: Endoscopy;  Laterality: N/A;  . Esophagogastroduodenoscopy (egd) with propofol N/A 01/14/2015    Procedure: ESOPHAGOGASTRODUODENOSCOPY (EGD) WITH PROPOFOL;  Surgeon: Scot Junobert T Alexxus Sobh, MD;  [facility];  Service: Endoscopy;  Laterality: N/A;    Prior to Admission medications   Medication Sig Start Date End Date Taking? Authorizing Provider  HYDROcodone-acetaminophen (NORCO/VICODIN) 5-325 MG per tablet Take 1-2 tablets by mouth every 6 (six) hours as needed for pain. Patient not taking: Reported on 04/29/2015 10/11/12   Gwyneth SproutWhitney Plunkett, MD  ibuprofen (ADVIL,MOTRIN) 200 MG tablet Take 400 mg by mouth every 6 (six) hours as needed for pain. Reported on 04/29/2015    Historical Provider, MD  ondansetron (ZOFRAN ODT) 4 MG disintegrating tablet Take 1 tablet (4 mg total) by mouth every 8 (eight) hours as needed for nausea or vomiting. Patient not taking: Reported on 04/29/2015 09/20/14   Gayla DossEryka A Gayle, MD  oxyCODONE (ROXICODONE) 5 MG immediate release tablet Take 1 tablet (5 mg total) by mouth every 6 (six) hours as needed for moderate pain. Do not drive while taking this medication. Patient not taking: Reported on 04/29/2015 09/20/14   Gayla DossEryka A Gayle, MD   sulfamethoxazole-trimethoprim (BACTRIM DS) 800-160 MG per tablet Take 1 tablet by mouth 2 (two) times daily. Reported on 04/29/2015    Historical Provider, MD  tamsulosin (FLOMAX) 0.4 MG CAPS capsule Take 1 capsule (0.4 mg total) by mouth daily. Patient not taking: Reported on 04/29/2015 09/20/14   Gayla DossEryka A Gayle, MD    Allergies as of 03/17/2015  . (No Known Allergies)    Family History  Problem Relation Age of Onset  . Breast cancer Maternal Aunt   . Breast cancer Maternal Grandmother     Social History   Social History  . Marital Status: Divorced    Spouse Name: N/A  . Number of Children: N/A  . Years of Education: N/A   Occupational History  . Not on file.   Social History Main Topics  . Smoking status: Never Smoker   . Smokeless tobacco: Never Used  . Alcohol Use: No  . Drug Use: No  . Sexual Activity: Not on file   Other Topics Concern  . Not on file   Social History Narrative   ** Merged History Encounter **        Review of Systems: See HPI, otherwise negative ROS  Physical Exam: BP 120/70 mmHg  Pulse 72  Temp(Src) 97.7 F (36.5 C) (Tympanic)  Resp 16  Ht 5\' 4"  (1.626 m)  Wt 83.915 kg (185 lb)  BMI 31.74 kg/m2  SpO2 100%  LMP  General:   Alert,  pleasant and cooperative in NAD Head:  Normocephalic and atraumatic. Neck:  Supple; no masses or thyromegaly. Lungs:  Clear throughout to auscultation.    Heart:  Regular rate and rhythm. Abdomen:  Soft, nontender and nondistended. Normal bowel sounds, without guarding, and without rebound.   Neurologic:  Alert and  oriented x4;  grossly normal neurologically.  Impression/Plan: Amanda Solomon is here for an endoscopy to be performed for follow up gastric ulcer after treatment  Risks, benefits, limitations, and alternatives regarding  endoscopy have been reviewed with the patient.  Questions have been answered.  All parties agreeable.   Lynnae Prude, MD  04/29/2015, 7:30 AM

## 2015-04-29 NOTE — Anesthesia Preprocedure Evaluation (Signed)
Anesthesia Evaluation  Patient identified by MRN, date of birth, ID band Patient awake    Reviewed: Allergy & Precautions, H&P , NPO status , Patient's Chart, lab work & pertinent test results, reviewed documented beta blocker date and time   History of Anesthesia Complications (+) PONV and history of anesthetic complications  Airway Mallampati: III  TM Distance: >3 FB Neck ROM: full    Dental no notable dental hx. (+) Chipped, Teeth Intact   Pulmonary neg pulmonary ROS,    Pulmonary exam normal breath sounds clear to auscultation       Cardiovascular Exercise Tolerance: Good negative cardio ROS Normal cardiovascular exam Rhythm:regular Rate:Normal     Neuro/Psych negative neurological ROS  negative psych ROS   GI/Hepatic negative GI ROS, Neg liver ROS,   Endo/Other  negative endocrine ROS  Renal/GU Renal disease (kidney stones)  negative genitourinary   Musculoskeletal   Abdominal   Peds  Hematology negative hematology ROS (+)   Anesthesia Other Findings Past Medical History:   B12 deficiency                                               Nephrolithiasis                                              Osteoarthritis                                               PONV (postoperative nausea and vomiting)                     Kidney stone                                                 Reproductive/Obstetrics negative OB ROS                             Anesthesia Physical Anesthesia Plan  ASA: II  Anesthesia Plan: General   Post-op Pain Management:    Induction:   Airway Management Planned:   Additional Equipment:   Intra-op Plan:   Post-operative Plan:   Informed Consent: I have reviewed the patients History and Physical, chart, labs and discussed the procedure including the risks, benefits and alternatives for the proposed anesthesia with the patient or authorized  representative who has indicated his/her understanding and acceptance.   Dental Advisory Given  Plan Discussed with: Anesthesiologist, CRNA and Surgeon  Anesthesia Plan Comments:         Anesthesia Quick Evaluation

## 2015-04-29 NOTE — Transfer of Care (Signed)
Immediate Anesthesia Transfer of Care Note  Patient: Amanda Solomon  Procedure(s) Performed: Procedure(s): ESOPHAGOGASTRODUODENOSCOPY (EGD) WITH PROPOFOL (N/A)  Patient Location: PACU  Anesthesia Type:General  Level of Consciousness: awake, alert  and oriented  Airway & Oxygen Therapy: Pt spontaneously breathing  Post-op Assessment: Report given to RN and Post -op Vital signs reviewed and stable  Post vital signs: Reviewed and stable  Last Vitals:  Filed Vitals:   04/29/15 0654  BP: 120/70  Pulse: 72  Temp: 36.5 C  Resp: 16    Last Pain: There were no vitals filed for this visit.       Complications: No apparent anesthesia complications

## 2015-05-02 ENCOUNTER — Encounter: Payer: Self-pay | Admitting: Unknown Physician Specialty

## 2015-05-13 DIAGNOSIS — H524 Presbyopia: Secondary | ICD-10-CM | POA: Diagnosis not present

## 2015-05-13 DIAGNOSIS — H5203 Hypermetropia, bilateral: Secondary | ICD-10-CM | POA: Diagnosis not present

## 2015-05-13 DIAGNOSIS — H52223 Regular astigmatism, bilateral: Secondary | ICD-10-CM | POA: Diagnosis not present

## 2015-06-15 DIAGNOSIS — E538 Deficiency of other specified B group vitamins: Secondary | ICD-10-CM | POA: Diagnosis not present

## 2015-06-15 DIAGNOSIS — M1991 Primary osteoarthritis, unspecified site: Secondary | ICD-10-CM | POA: Diagnosis not present

## 2015-06-15 DIAGNOSIS — N200 Calculus of kidney: Secondary | ICD-10-CM | POA: Diagnosis not present

## 2015-06-22 DIAGNOSIS — N200 Calculus of kidney: Secondary | ICD-10-CM | POA: Diagnosis not present

## 2015-06-22 DIAGNOSIS — E538 Deficiency of other specified B group vitamins: Secondary | ICD-10-CM | POA: Diagnosis not present

## 2015-06-22 DIAGNOSIS — D500 Iron deficiency anemia secondary to blood loss (chronic): Secondary | ICD-10-CM | POA: Diagnosis not present

## 2015-06-22 DIAGNOSIS — M1991 Primary osteoarthritis, unspecified site: Secondary | ICD-10-CM | POA: Diagnosis not present

## 2015-07-06 DIAGNOSIS — M7071 Other bursitis of hip, right hip: Secondary | ICD-10-CM | POA: Diagnosis not present

## 2015-09-06 ENCOUNTER — Ambulatory Visit: Payer: 59 | Attending: Anesthesiology | Admitting: Anesthesiology

## 2015-09-06 ENCOUNTER — Encounter: Payer: Self-pay | Admitting: Anesthesiology

## 2015-09-06 VITALS — BP 122/72 | HR 77 | Temp 98.1°F | Resp 16 | Ht 64.0 in | Wt 185.0 lb

## 2015-09-06 DIAGNOSIS — M7918 Myalgia, other site: Secondary | ICD-10-CM

## 2015-09-06 DIAGNOSIS — M791 Myalgia: Secondary | ICD-10-CM | POA: Insufficient documentation

## 2015-09-06 DIAGNOSIS — Z981 Arthrodesis status: Secondary | ICD-10-CM | POA: Diagnosis not present

## 2015-09-06 DIAGNOSIS — Z87442 Personal history of urinary calculi: Secondary | ICD-10-CM | POA: Diagnosis not present

## 2015-09-06 DIAGNOSIS — E538 Deficiency of other specified B group vitamins: Secondary | ICD-10-CM | POA: Insufficient documentation

## 2015-09-06 DIAGNOSIS — M797 Fibromyalgia: Secondary | ICD-10-CM | POA: Diagnosis not present

## 2015-09-06 DIAGNOSIS — M545 Low back pain: Secondary | ICD-10-CM | POA: Insufficient documentation

## 2015-09-06 DIAGNOSIS — M47816 Spondylosis without myelopathy or radiculopathy, lumbar region: Secondary | ICD-10-CM

## 2015-09-06 DIAGNOSIS — M5386 Other specified dorsopathies, lumbar region: Secondary | ICD-10-CM

## 2015-09-06 MED ORDER — ROPIVACAINE HCL 2 MG/ML IJ SOLN: 10.0000 mL | Freq: Once | INTRAMUSCULAR | Status: DC

## 2015-09-06 MED ORDER — DEXAMETHASONE SODIUM PHOSPHATE 10 MG/ML IJ SOLN
INTRAMUSCULAR | Status: AC
  Filled 2015-09-06: qty 1

## 2015-09-06 MED ORDER — ROPIVACAINE HCL 2 MG/ML IJ SOLN
INTRAMUSCULAR | Status: AC
  Filled 2015-09-06: qty 10

## 2015-09-06 MED ORDER — DEXAMETHASONE SODIUM PHOSPHATE 10 MG/ML IJ SOLN: 10.0000 mg | Freq: Once | INTRAMUSCULAR | Status: DC

## 2015-09-06 NOTE — Patient Instructions (Signed)

## 2015-09-06 NOTE — Progress Notes (Signed)
Safety precautions to be maintained throughout the outpatient stay will include: orient to surroundings, keep bed in low position, maintain call bell within reach at all times, provide assistance with transfer out of bed and ambulation.  

## 2015-09-08 NOTE — Progress Notes (Signed)
Subjective:  Patient ID: Amanda Solomon, female    DOB: [birth year]  Age: 55 y.o. MRN: [id number]  CC: Back Pain (low and right)   Service Provided on Last Visit: Evaluation (new patient today)  PROCEDURE:trigger point injection 2 to the right  HPI Amanda Solomon presents for a new patient evaluation. She is experiencing right lower in that has been unremitting and was related to some twisting acti in mid-August. 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 Cold packs and medication management but nonetheless the pain has been severe and unremitting.  Stretching activities have failed to  And anti-inflammatories have yielded minimal pain relief. She denies problems of bowel or bladder dysfunction. Lower extremity strength has been good  History Amanda Solomon has a past medical history of Anemia; B12 deficiency; Kidney stone; Nephrolithiasis; Osteoarthritis; and PONV (postoperative nausea and vomiting).   She has a past surgical history that includes Anterior fusion cervical spine; Thyroglossal duct cyst; Incision and drainage / excision thyroglossal cyst; Incision and drainage (10/2012); Colonoscopy with propofol (N/A, 01/14/2015); Esophagogastroduodenoscopy (egd) with propofol (N/A, 01/14/2015); and Esophagogastroduodenoscopy (egd) with propofol (N/A, 04/29/2015).   Her family history includes Breast cancer in her maternal aunt and maternal grandmother.She reports that she has never smoked. She has never used smokeless tobacco. She reports that she does not drink alcohol or use drugs.  No results found for this or any previous visit.  No results found for: TOXASSSELUR  Outpatient Medications Prior to Visit  Medication Sig Dispense Refill  . HYDROcodone-acetaminophen (NORCO/VICODIN) 5-325 MG per tablet Take 1-2 tablets by mouth every 6 (six) hours  as needed for pain. (Patient not taking: Reported on 04/29/2015) 30 tablet 0  . ibuprofen (ADVIL,MOTRIN) 200 MG tablet Take 400 mg by mouth every 6 (six) hours as needed for pain. Reported on 04/29/2015    . ondansetron (ZOFRAN ODT) 4 MG disintegrating tablet Take 1 tablet (4 mg total) by mouth every 8 (eight) hours as needed for nausea or vomiting. (Patient not taking: Reported on 04/29/2015) 12 tablet 0  . oxyCODONE (ROXICODONE) 5 MG immediate release tablet Take 1 tablet (5 mg total) by mouth every 6 (six) hours as needed for moderate pain. Do not drive while taking this medication. (Patient not taking: Reported on 04/29/2015) 12 tablet 0  . sulfamethoxazole-trimethoprim (BACTRIM DS) 800-160 MG per tablet Take 1 tablet by mouth 2 (two) times daily. Reported on 04/29/2015    . tamsulosin (FLOMAX) 0.4 MG CAPS capsule Take 1 capsule (0.4 mg total) by mouth daily. (Patient not taking: Reported on 04/29/2015) 14 capsule 0   No facility-administered medications prior to visit.    Lab Results  Component Value Date   WBC 9.2 09/20/2014   HGB 11.4 (L) 09/20/2014   HCT 35.2 09/20/2014   PLT 335 09/20/2014   GLUCOSE 99 09/20/2014   ALT 21 09/20/2014   AST 23 09/20/2014   NA 138 09/20/2014   K 3.8 09/20/2014   CL 106 09/20/2014   CREATININE 1.07 (H) 09/20/2014   BUN 9 09/20/2014   CO2 24 09/20/2014    --------------------------------------------------------------------------------------------------------------------- No results found.     ---------------------------------------------------------------------------------------------------------------------- Past Medical History:  Diagnosis Date  . Anemia   . B12 deficiency   . Kidney stone   . Nephrolithiasis   . Osteoarthritis   . PONV (postoperative nausea and vomiting)  Past Surgical History:  Procedure Laterality Date  . ANTERIOR FUSION CERVICAL SPINE    . COLONOSCOPY WITH PROPOFOL N/A 01/14/2015   Procedure: COLONOSCOPY WITH  PROPOFOL;  Surgeon: Scot Junobert T Elliott, MD;  [facility];  Service: Endoscopy;  Laterality: N/A;  . ESOPHAGOGASTRODUODENOSCOPY (EGD) WITH PROPOFOL N/A 01/14/2015   Procedure: ESOPHAGOGASTRODUODENOSCOPY (EGD) WITH PROPOFOL;  Surgeon: Scot Junobert T Elliott, MD;  [facility];  Service: Endoscopy;  Laterality: N/A;  . ESOPHAGOGASTRODUODENOSCOPY (EGD) WITH PROPOFOL N/A 04/29/2015   Procedure: ESOPHAGOGASTRODUODENOSCOPY (EGD) WITH PROPOFOL;  Surgeon: Scot Junobert T Elliott, MD;  [facility];  Service: Endoscopy;  Laterality: N/A;  . Incision and drainage  10/2012   Finger abscess  . INCISION AND DRAINAGE / EXCISION THYROGLOSSAL CYST    . THYROGLOSSAL DUCT CYST      Family History  Problem Relation Age of Onset  . Breast cancer Maternal Aunt   . Breast cancer Maternal Grandmother     Social History  Substance Use Topics  . Smoking status: Never Smoker  . Smokeless tobacco: Never Used  . Alcohol use No    ---------------------------------------------------------------------------------------------------------------------- Social History   Social History  . Marital status: Divorced    Spouse name: N/A  . Number of children: N/A  . Years of education: N/A   Social History Main Topics  . Smoking status: Never Smoker  . Smokeless tobacco: Never Used  . Alcohol use No  . Drug use: No  . Sexual activity: Not Asked   Other Topics Concern  . None   Social History Narrative   ** Merged History Encounter **        Scheduled Meds: . dexamethasone  10 mg Other Once  . ropivacaine (PF) 2 mg/ml (0.2%)  10 mL Epidural Once   Continuous Infusions:  PRN Meds:.   BP 122/72   Pulse 77   Temp 98.1 F (36.7 C) (Oral)   Resp 16   Ht 5\' 4"  (1.626 m)   Wt 185 lb (83.9 kg)   LMP  (Within Years)   SpO2 100%   BMI 31.76 kg/m    BP Readings from Last 3 Encounters:  09/06/15 122/72  04/29/15 104/62  01/14/15 (!) 112/48     Wt Readings from Last 3  Encounters:  09/06/15 185 lb (83.9 kg)  04/29/15 185 lb (83.9 kg)  01/14/15 190 lb (86.2 kg)     ----------------------------------------------------------------------------------------------------------------------  ROS Review of Systems  Cardiac: Negative Pulmonary: Negative GI: Negative GU: Kidney stones  Objective:  BP 122/72   Pulse 77   Temp 98.1 F (36.7 C) (Oral)   Resp 16   Ht 5\' 4"  (1.626 m)   Wt 185 lb (83.9 kg)   LMP  (Within Years)   SpO2 100%   BMI 31.76 kg/m   Physical Exam patient is alert oriented and a good historian Heart is regular rate and rhythm Lungs are clear to also auscultation Inspection of the low back reveal 2 trigger points in the right lumbar region Negative straight leg raise bilaterally With the patient standing with extension at the low back and right lateral rotation she does have reproduction of pain this is negative on the left     Assessment & Plan:   Amanda GowerCharlotte was seen today for back pain.  Diagnoses and all orders for this visit:  Low back derangement syndrome -     TRIGGER POINT INJECTION -     TRIGGER POINT INJECTION; Future -     ropivacaine (PF) 2 mg/ml (  0.2%) (NAROPIN) epidural 10 mL; 10 mLs by Epidural route once. -     dexamethasone (DECADRON) injection 10 mg; 1 mL (10 mg total) by Other route once.  Myofacial muscle pain -     TRIGGER POINT INJECTION -     TRIGGER POINT INJECTION; Future -     ropivacaine (PF) 2 mg/ml (0.2%) (NAROPIN) epidural 10 mL; 10 mLs by Epidural route once. -     dexamethasone (DECADRON) injection 10 mg; 1 mL (10 mg total) by Other route once.  Facet arthritis of lumbar region  Other orders -     ropivacaine (PF) 2 mg/ml (0.2%) (NAROPIN) 2 MG/ML epidural;  -     dexamethasone (DECADRON) 10 MG/ML injection;      ----------------------------------------------------------------------------------------------------------------------  Problem List Items Addressed This Visit    None     Visit Diagnoses    Low back derangement syndrome    -  Primary   Relevant Medications   tiZANidine (ZANAFLEX) 4 MG capsule   ropivacaine (PF) 2 mg/ml (0.2%) (NAROPIN) epidural 10 mL   dexamethasone (DECADRON) injection 10 mg   Other Relevant Orders   TRIGGER POINT INJECTION   TRIGGER POINT INJECTION   Myofacial muscle pain       Relevant Medications   ropivacaine (PF) 2 mg/ml (0.2%) (NAROPIN) epidural 10 mL   dexamethasone (DECADRON) injection 10 mg   Other Relevant Orders   TRIGGER POINT INJECTION   TRIGGER POINT INJECTION   Facet arthritis of lumbar region       Relevant Medications   tiZANidine (ZANAFLEX) 4 MG capsule   dexamethasone (DECADRON) injection 10 mg      ----------------------------------------------------------------------------------------------------------------------  1. Low back derangement syndrome Based on her discussion today she would like to proceed with a trigger point injection. The risks and benefits have been reviewed and questions answered. - TRIGGER POINT INJECTION - TRIGGER POINT INJECTION; Future - ropivacaine (PF) 2 mg/ml (0.2%) (NAROPIN) epidural 10 mL; 10 mLs by Epidural route once. - dexamethasone (DECADRON) injection 10 mg; 1 mL (10 mg total) by Other route once.  2. Myofacial muscle pain As above. Continue with back stretching and strengthening activities as discussed - TRIGGER POINT INJECTION - TRIGGER POINT INJECTION; Future - ropivacaine (PF) 2 mg/ml (0.2%) (NAROPIN) epidural 10 mL; 10 mLs by Epidural route once. - dexamethasone (DECADRON) injection 10 mg; 1 mL (10 mg total) by Other route once.  3. Facet arthritis of lumbar region We may consider a facet injection if she fails to get relief with this.    ----------------------------------------------------------------------------------------------------------------------  I am having Amanda Solomon maintain her HYDROcodone-acetaminophen, sulfamethoxazole-trimethoprim, ibuprofen,  oxyCODONE, ondansetron, tamsulosin, omeprazole, tiZANidine, ferrous sulfate, and Cyanocobalamin (VITAMIN B 12 PO). We will continue to administer ropivacaine (PF) 2 mg/ml (0.2%) and dexamethasone.   Meds ordered this encounter  Medications  . omeprazole (PRILOSEC) 40 MG capsule    Sig: Take 40 mg by mouth daily.  Marland Kitchen tiZANidine (ZANAFLEX) 4 MG capsule    Sig: Take 4 mg by mouth 3 (three) times daily as needed for muscle spasms.  . ferrous sulfate 325 (65 FE) MG EC tablet    Sig: Take 325 mg by mouth 3 (three) times daily with meals.  . Cyanocobalamin (VITAMIN B 12 PO)    Sig: Take by mouth.  . ropivacaine (PF) 2 mg/ml (0.2%) (NAROPIN) epidural 10 mL  . dexamethasone (DECADRON) injection 10 mg  . ropivacaine (PF) 2 mg/ml (0.2%) (NAROPIN) 2 MG/ML epidural    Roney Mans L: cabinet override  .  dexamethasone (DECADRON) 10 MG/ML injection    Roney Mans L: cabinet override    procedure:Trigger point injection: The area overlying the aforementioned trigger points were prepped with alcohol. They were then injected with a 25-gauge needle with 4 cc of ropivacaine 0.2% and Decadron 4 mg at each site after negative aspiration for heme. This was performed after informed consent was obtained and risks and benefits reviewed. She tolerated this procedure without difficulty and was convalesced and discharged to home in stable condition for follow-up as mentioned.  @Aishwarya Shiplett, MD@   Follow-up: Return in about 3 weeks (around 09/27/2015) for evaluation, procedure.    Yevette Edwards, MD  This dictation was performed utilizing Dragon voice recognition software.  Please excuse any unintentional or mistaken typographical errors as a result of its unedited utilization.

## 2015-09-26 ENCOUNTER — Ambulatory Visit: Payer: Self-pay | Admitting: Anesthesiology

## 2015-09-29 ENCOUNTER — Ambulatory Visit: Payer: Self-pay | Admitting: Anesthesiology

## 2015-10-10 ENCOUNTER — Ambulatory Visit: Payer: Self-pay | Admitting: Anesthesiology

## 2015-10-24 ENCOUNTER — Encounter: Payer: Self-pay | Admitting: Anesthesiology

## 2015-10-24 ENCOUNTER — Ambulatory Visit: Payer: 59 | Attending: Anesthesiology | Admitting: Anesthesiology

## 2015-10-24 DIAGNOSIS — M7918 Myalgia, other site: Secondary | ICD-10-CM

## 2015-10-24 DIAGNOSIS — M545 Low back pain: Secondary | ICD-10-CM | POA: Insufficient documentation

## 2015-10-24 DIAGNOSIS — M5386 Other specified dorsopathies, lumbar region: Secondary | ICD-10-CM

## 2015-10-24 DIAGNOSIS — M791 Myalgia: Secondary | ICD-10-CM | POA: Insufficient documentation

## 2015-10-24 DIAGNOSIS — D519 Vitamin B12 deficiency anemia, unspecified: Secondary | ICD-10-CM | POA: Insufficient documentation

## 2015-10-24 MED ORDER — ROPIVACAINE HCL 2 MG/ML IJ SOLN
INTRAMUSCULAR | Status: AC
  Administered 2015-10-24: 14:00:00
  Filled 2015-10-24: qty 10

## 2015-10-24 MED ORDER — DEXAMETHASONE SODIUM PHOSPHATE 10 MG/ML IJ SOLN
INTRAMUSCULAR | Status: AC
  Administered 2015-10-24: 14:00:00
  Filled 2015-10-24: qty 1

## 2015-10-24 MED ORDER — METHOCARBAMOL 500 MG PO TABS: 500.0000 mg | ORAL_TABLET | Freq: Two times a day (BID) | ORAL | 2 refills | Status: AC | PRN

## 2015-10-24 NOTE — Progress Notes (Signed)
Safety precautions to be maintained throughout the outpatient stay will include: orient to surroundings, keep bed in low position, maintain call bell within reach at all times, provide assistance with transfer out of bed and ambulation.  

## 2015-10-24 NOTE — Progress Notes (Signed)
Subjective:  Patient ID: Amanda Solomon, female    DOB: [birth year]  Age: 55 y.o. MRN: [id number]  CC: Back Pain (low and right)   Service Provided on Last Visit: Procedure (TPI)  PROCEDURE:trigger point injection 2 #2 to the right Gluteal region overlying the SI joint  HPI Amanda Solomon presents for a reevaluation. She responded favorably to her last trigger point injection. Her pain has improved significantly. She's having fewer bad days and overall feels like he's made great progress. She is desiring to proceed with a repeat injection today. The quality characteristic and description of her pain are unchanged in nature. She is performing stretching exercises and taking her Robaxin as prescribed.   History Scottlyn has a past medical history of Anemia; B12 deficiency; Kidney stone; Nephrolithiasis; Osteoarthritis; and PONV (postoperative nausea and vomiting).   She has a past surgical history that includes Anterior fusion cervical spine; Thyroglossal duct cyst; Incision and drainage / excision thyroglossal cyst; Incision and drainage (10/2012); Colonoscopy with propofol (N/A, 01/14/2015); Esophagogastroduodenoscopy (egd) with propofol (N/A, 01/14/2015); and Esophagogastroduodenoscopy (egd) with propofol (N/A, 04/29/2015).   Her family history includes Breast cancer in her maternal aunt and maternal grandmother.She reports that she has never smoked. She has never used smokeless tobacco. She reports that she does not drink alcohol or use drugs.  No results found for this or any previous visit.  No results found for: TOXASSSELUR  Outpatient Medications Prior to Visit  Medication Sig Dispense Refill  . Cyanocobalamin (VITAMIN B 12 PO) Take by mouth.    . ferrous sulfate 325 (65 FE) MG EC tablet Take 325 mg by mouth 3 (three) times daily with meals.    Marland Kitchen omeprazole (PRILOSEC) 40 MG capsule Take 40 mg by mouth daily.    Marland Kitchen tiZANidine (ZANAFLEX) 4 MG capsule Take 4 mg by mouth at bedtime  as needed for muscle spasms.     Marland Kitchen HYDROcodone-acetaminophen (NORCO/VICODIN) 5-325 MG per tablet Take 1-2 tablets by mouth every 6 (six) hours as needed for pain. (Patient not taking: Reported on 10/24/2015) 30 tablet 0  . ibuprofen (ADVIL,MOTRIN) 200 MG tablet Take 400 mg by mouth every 6 (six) hours as needed for pain. Reported on 04/29/2015    . ondansetron (ZOFRAN ODT) 4 MG disintegrating tablet Take 1 tablet (4 mg total) by mouth every 8 (eight) hours as needed for nausea or vomiting. (Patient not taking: Reported on 10/24/2015) 12 tablet 0  . oxyCODONE (ROXICODONE) 5 MG immediate release tablet Take 1 tablet (5 mg total) by mouth every 6 (six) hours as needed for moderate pain. Do not drive while taking this medication. (Patient not taking: Reported on 10/24/2015) 12 tablet 0  . sulfamethoxazole-trimethoprim (BACTRIM DS) 800-160 MG per tablet Take 1 tablet by mouth 2 (two) times daily. Reported on 04/29/2015    . tamsulosin (FLOMAX) 0.4 MG CAPS capsule Take 1 capsule (0.4 mg total) by mouth daily. (Patient not taking: Reported on 10/24/2015) 14 capsule 0   Facility-Administered Medications Prior to Visit  Medication Dose Route Frequency Provider Last Rate Last Dose  . dexamethasone (DECADRON) injection 10 mg  10 mg Other Once Yevette Edwards, MD      . ropivacaine (PF) 2 mg/ml (0.2%) (NAROPIN) epidural 10 mL  10 mL Epidural Once Yevette Edwards, MD       Lab Results  Component Value Date   WBC 9.2 09/20/2014   HGB 11.4 (L) 09/20/2014   HCT 35.2 09/20/2014   PLT 335 09/20/2014  GLUCOSE 99 09/20/2014   ALT 21 09/20/2014   AST 23 09/20/2014   NA 138 09/20/2014   K 3.8 09/20/2014   CL 106 09/20/2014   CREATININE 1.07 (H) 09/20/2014   BUN 9 09/20/2014   CO2 24 09/20/2014    --------------------------------------------------------------------------------------------------------------------- No results  found.     ---------------------------------------------------------------------------------------------------------------------- Past Medical History:  Diagnosis Date  . Anemia   . B12 deficiency   . Kidney stone   . Nephrolithiasis   . Osteoarthritis   . PONV (postoperative nausea and vomiting)     Past Surgical History:  Procedure Laterality Date  . ANTERIOR FUSION CERVICAL SPINE    . COLONOSCOPY WITH PROPOFOL N/A 01/14/2015   Procedure: COLONOSCOPY WITH PROPOFOL;  Surgeon: Scot Jun, MD;  Location: [facility] ENDOSCOPY;  Service: Endoscopy;  Laterality: N/A;  . ESOPHAGOGASTRODUODENOSCOPY (EGD) WITH PROPOFOL N/A 01/14/2015   Procedure: ESOPHAGOGASTRODUODENOSCOPY (EGD) WITH PROPOFOL;  Surgeon: Scot Jun, MD;  Location: [facility] ENDOSCOPY;  Service: Endoscopy;  Laterality: N/A;  . ESOPHAGOGASTRODUODENOSCOPY (EGD) WITH PROPOFOL N/A 04/29/2015   Procedure: ESOPHAGOGASTRODUODENOSCOPY (EGD) WITH PROPOFOL;  Surgeon: Scot Jun, MD;  Location: [facility] ENDOSCOPY;  Service: Endoscopy;  Laterality: N/A;  . Incision and drainage  10/2012   Finger abscess  . INCISION AND DRAINAGE / EXCISION THYROGLOSSAL CYST    . THYROGLOSSAL DUCT CYST      Family History  Problem Relation Age of Onset  . Breast cancer Maternal Aunt   . Breast cancer Maternal Grandmother     Social History  Substance Use Topics  . Smoking status: Never Smoker  . Smokeless tobacco: Never Used  . Alcohol use No    ---------------------------------------------------------------------------------------------------------------------- Social History   Social History  . Marital status: Divorced    Spouse name: N/A  . Number of children: N/A  . Years of education: N/A   Social History Main Topics  . Smoking status: Never Smoker  . Smokeless tobacco: Never Used  . Alcohol use No  . Drug use: No  . Sexual activity: Not Asked   Other Topics Concern  . None   Social History Narrative   ** Merged History  Encounter **        Scheduled Meds: . dexamethasone  10 mg Other Once  . ropivacaine (PF) 2 mg/ml (0.2%)  10 mL Epidural Once   Continuous Infusions:  PRN Meds:.   BP 125/73   Pulse 83   Temp 97.7 F (36.5 C) (Oral)   Resp 18   Ht 5\' 3"  (1.6 m)   Wt 180 lb (81.6 kg)   LMP  (Within Years)   SpO2 100%   BMI 31.89 kg/m    BP Readings from Last 3 Encounters:  10/24/15 125/73  09/06/15 122/72  04/29/15 104/62     Wt Readings from Last 3 Encounters:  10/24/15 180 lb (81.6 kg)  09/06/15 185 lb (83.9 kg)  04/29/15 185 lb (83.9 kg)     ----------------------------------------------------------------------------------------------------------------------  ROS Review of Systems  No changes mentioned  Objective:  BP 125/73   Pulse 83   Temp 97.7 F (36.5 C) (Oral)   Resp 18   Ht 5\' 3"  (1.6 m)   Wt 180 lb (81.6 kg)   LMP  (Within Years)   SpO2 100%   BMI 31.89 kg/m   Physical Exam patient is alert oriented and a good historian Heart is regular rate and rhythm Lungs are clear to also auscultation Inspection of the low back reveal 2 trigger points in the right  lumbar regionOverlying the right SI joint Negative straight leg raise bilaterally With the patient standing with extension at the low back and right lateral rotation she does have reproduction of pain this is negative on the left     Assessment & Plan:   Claris GowerCharlotte was seen today for back pain.  Diagnoses and all orders for this visit:  Low back derangement syndrome -     TRIGGER POINT INJECTION -     TRIGGER POINT INJECTION; Future  Myofacial muscle pain -     TRIGGER POINT INJECTION -     TRIGGER POINT INJECTION; Future  Other orders -     methocarbamol (ROBAXIN) 500 MG tablet; Take 1 tablet (500 mg total) by mouth 2 (two) times daily as needed for muscle spasms. -     ropivacaine (PF) 2 mg/ml (0.2%) (NAROPIN) 2 MG/ML epidural;  -     dexamethasone (DECADRON) 10 MG/ML injection;       ----------------------------------------------------------------------------------------------------------------------  Problem List Items Addressed This Visit    None    Visit Diagnoses    Low back derangement syndrome       Relevant Medications   methocarbamol (ROBAXIN) 500 MG tablet   dexamethasone (DECADRON) 10 MG/ML injection (Completed)   Other Relevant Orders   TRIGGER POINT INJECTION   Myofacial muscle pain       Relevant Orders   TRIGGER POINT INJECTION      ----------------------------------------------------------------------------------------------------------------------  1. Low back derangement syndrome Based on her discussion today she would like to proceed with a Repeat trigger point injection. The risks and benefits have been once again reviewed and questions answered. - TRIGGER POINT INJECTION - TRIGGER POINT INJECTION; Future - ropivacaine (PF) 2 mg/ml (0.2%) (NAROPIN) epidural 10 mL; 10 mLs by Epidural route once. - dexamethasone (DECADRON) injection 10 mg; 1 mL (10 mg total) by Other route once.  2. Myofacial muscle pain As above. Continue with back stretching and strengthening activities as discussed - TRIGGER POINT INJECTION - TRIGGER POINT INJECTION; Future - ropivacaine (PF) 2 mg/ml (0.2%) (NAROPIN) epidural 10 mL; 10 mLs by Epidural route once. - dexamethasone (DECADRON) injection 10 mg; 1 mL (10 mg total) by Other route once.  3. Facet arthritis of lumbar region We may consider a facet injection if she fails to get relief with this. She is to return to clinic in 2 months    ----------------------------------------------------------------------------------------------------------------------  I have changed Ms. Lader's methocarbamol. I am also having her maintain her HYDROcodone-acetaminophen, sulfamethoxazole-trimethoprim, ibuprofen, oxyCODONE, ondansetron, tamsulosin, omeprazole, tiZANidine, ferrous sulfate, and Cyanocobalamin (VITAMIN B  12 PO). We administered ropivacaine (PF) 2 mg/ml (0.2%) and dexamethasone. We will continue to administer ropivacaine (PF) 2 mg/ml (0.2%) and dexamethasone.   Meds ordered this encounter  Medications  . DISCONTD: methocarbamol (ROBAXIN) 500 MG tablet    Sig: Take 500 mg by mouth 2 (two) times daily as needed for muscle spasms.  . methocarbamol (ROBAXIN) 500 MG tablet    Sig: Take 1 tablet (500 mg total) by mouth 2 (two) times daily as needed for muscle spasms.    Dispense:  60 tablet    Refill:  2  . ropivacaine (PF) 2 mg/ml (0.2%) (NAROPIN) 2 MG/ML epidural    Roney MansSmith, Jennifer L: cabinet override  . dexamethasone (DECADRON) 10 MG/ML injection    Roney MansSmith, Jennifer L: cabinet override    procedure:Trigger point injection: The area overlying the aforementioned trigger points were prepped with alcohol. They were then injected with a 25-gauge needle with 4 cc of  ropivacaine 0.2% and Decadron 4 mg at each site after negative aspiration for heme. This was performed after informed consent was obtained and risks and benefits reviewed. She tolerated this procedure without difficulty and was convalesced and discharged to home in stable condition for follow-up as mentioned.  @[email or web address] Beever, MD@   Follow-up: Return for evaluation, procedure.    Yevette Edwards, MD  This dictation was performed utilizing Dragon voice recognition software.  Please excuse any unintentional or mistaken typographical errors as a result of its unedited utilization.

## 2015-10-25 ENCOUNTER — Telehealth: Payer: Self-pay | Admitting: *Deleted

## 2015-10-25 NOTE — Telephone Encounter (Signed)
No answer    Left voicemail

## 2015-11-04 ENCOUNTER — Telehealth: Payer: Self-pay | Admitting: *Deleted

## 2015-12-02 DIAGNOSIS — D500 Iron deficiency anemia secondary to blood loss (chronic): Secondary | ICD-10-CM | POA: Diagnosis not present

## 2015-12-02 DIAGNOSIS — Z0000 Encounter for general adult medical examination without abnormal findings: Secondary | ICD-10-CM | POA: Diagnosis not present

## 2015-12-02 DIAGNOSIS — E538 Deficiency of other specified B group vitamins: Secondary | ICD-10-CM | POA: Diagnosis not present

## 2015-12-14 ENCOUNTER — Other Ambulatory Visit: Payer: Self-pay | Admitting: Internal Medicine

## 2015-12-14 DIAGNOSIS — N200 Calculus of kidney: Secondary | ICD-10-CM | POA: Diagnosis not present

## 2015-12-14 DIAGNOSIS — E538 Deficiency of other specified B group vitamins: Secondary | ICD-10-CM | POA: Diagnosis not present

## 2015-12-14 DIAGNOSIS — Z1231 Encounter for screening mammogram for malignant neoplasm of breast: Secondary | ICD-10-CM

## 2015-12-14 DIAGNOSIS — Z0000 Encounter for general adult medical examination without abnormal findings: Secondary | ICD-10-CM | POA: Diagnosis not present

## 2015-12-14 DIAGNOSIS — D500 Iron deficiency anemia secondary to blood loss (chronic): Secondary | ICD-10-CM | POA: Diagnosis not present

## 2016-01-20 ENCOUNTER — Ambulatory Visit
Admission: RE | Admit: 2016-01-20 | Discharge: 2016-01-20 | Disposition: A | Discharge status: Home / Self Care | Payer: 59 | Source: Ambulatory Visit | Attending: Internal Medicine | Admitting: Internal Medicine

## 2016-01-20 DIAGNOSIS — Z1231 Encounter for screening mammogram for malignant neoplasm of breast: Secondary | ICD-10-CM | POA: Insufficient documentation

## 2016-01-20 DIAGNOSIS — E538 Deficiency of other specified B group vitamins: Secondary | ICD-10-CM | POA: Diagnosis not present

## 2016-02-17 DIAGNOSIS — M65312 Trigger thumb, left thumb: Secondary | ICD-10-CM | POA: Diagnosis not present

## 2016-02-20 DIAGNOSIS — E538 Deficiency of other specified B group vitamins: Secondary | ICD-10-CM | POA: Diagnosis not present

## 2016-02-27 DIAGNOSIS — Z9889 Other specified postprocedural states: Secondary | ICD-10-CM | POA: Diagnosis not present

## 2016-02-27 DIAGNOSIS — M65311 Trigger thumb, right thumb: Secondary | ICD-10-CM | POA: Diagnosis not present

## 2016-03-26 DIAGNOSIS — E538 Deficiency of other specified B group vitamins: Secondary | ICD-10-CM | POA: Diagnosis not present

## 2016-04-27 DIAGNOSIS — E538 Deficiency of other specified B group vitamins: Secondary | ICD-10-CM | POA: Diagnosis not present

## 2016-05-14 DIAGNOSIS — H5203 Hypermetropia, bilateral: Secondary | ICD-10-CM | POA: Diagnosis not present

## 2016-05-29 DIAGNOSIS — E538 Deficiency of other specified B group vitamins: Secondary | ICD-10-CM | POA: Diagnosis not present

## 2016-06-29 DIAGNOSIS — E538 Deficiency of other specified B group vitamins: Secondary | ICD-10-CM | POA: Diagnosis not present

## 2016-07-10 ENCOUNTER — Encounter: Payer: Self-pay | Admitting: Physician Assistant

## 2016-07-10 ENCOUNTER — Ambulatory Visit: Payer: Self-pay | Admitting: Physician Assistant

## 2016-07-10 VITALS — BP 120/82 | HR 77 | Temp 98.1°F

## 2016-07-10 DIAGNOSIS — J0100 Acute maxillary sinusitis, unspecified: Secondary | ICD-10-CM

## 2016-07-10 MED ORDER — FLUTICASONE PROPIONATE 50 MCG/ACT NA SUSP: 2.0000 | Freq: Every day | NASAL | 6 refills | Status: DC

## 2016-07-10 MED ORDER — AZITHROMYCIN 250 MG PO TABS: ORAL_TABLET | ORAL | 0 refills | Status: DC

## 2016-07-10 NOTE — Progress Notes (Signed)
S: C/o runny nose and congestion for 3 days, no fever, chills, cp/sob, v/d; mucus is green and thick, cough is sporadic, c/o of facial and dental pain.   Using otc meds: sudafed, tylenol  O: PE: vitals wnl, nad,  perrl eomi, normocephalic, tms dull, nasal mucosa red and swollen, throat injected, neck supple no lymph, lungs c t a, cv rrr, neuro intact  A:  Acute sinusitis   P: drink fluids, continue regular meds , use otc meds of choice, return if not improving in 5 days, return earlier if worsening , zpack, flonase

## 2016-07-16 ENCOUNTER — Ambulatory Visit: Payer: Self-pay | Admitting: Physician Assistant

## 2016-07-16 ENCOUNTER — Encounter: Payer: Self-pay | Admitting: Physician Assistant

## 2016-07-16 VITALS — BP 110/79 | HR 87 | Temp 98.5°F | Resp 16

## 2016-07-16 DIAGNOSIS — J209 Acute bronchitis, unspecified: Secondary | ICD-10-CM

## 2016-07-16 MED ORDER — HYDROCOD POLST-CPM POLST ER 10-8 MG/5ML PO SUER: 5.0000 mL | Freq: Two times a day (BID) | ORAL | 0 refills | Status: DC | PRN

## 2016-07-16 MED ORDER — ALBUTEROL SULFATE HFA 108 (90 BASE) MCG/ACT IN AERS: 2.0000 | INHALATION_SPRAY | Freq: Four times a day (QID) | RESPIRATORY_TRACT | 0 refills | Status: DC | PRN

## 2016-07-16 MED ORDER — IPRATROPIUM-ALBUTEROL 0.5-2.5 (3) MG/3ML IN SOLN: 3.0000 mL | Freq: Once | RESPIRATORY_TRACT | Status: DC

## 2016-07-16 MED ORDER — METHYLPREDNISOLONE 4 MG PO TBPK: ORAL_TABLET | ORAL | 0 refills | Status: DC

## 2016-07-16 NOTE — Progress Notes (Signed)
S: C/o cough and congestion with wheezing, chest is sore from coughing, denies fever, chills,  cough is dry and hacking; keeping pt awake at night;  denies cardiac type chest pain or sob, v/d, abd pain, almost vomited after coughing last night, did take last pill on the zpack on SAturday Remainder ros neg  O: vitals wnl, nad, tms clear, throat injected, neck supple no lymph, lungs with wheezing, cv rrr, neuro intact  A:  Acute bronchitis   P:  rx medication: medrol dose pack, albuterol inhaler, tussionex 150ml nr;  use otc meds, tylenol or motrin as needed for fever/chills, return if not better in 3 -5 days, return earlier if worsening, if not improving witht hese medications can try another antibiotic or get xray

## 2016-08-01 DIAGNOSIS — F520 Hypoactive sexual desire disorder: Secondary | ICD-10-CM | POA: Diagnosis not present

## 2016-08-01 DIAGNOSIS — F5231 Female orgasmic disorder: Secondary | ICD-10-CM | POA: Diagnosis not present

## 2016-08-01 DIAGNOSIS — R87612 Low grade squamous intraepithelial lesion on cytologic smear of cervix (LGSIL): Secondary | ICD-10-CM | POA: Diagnosis not present

## 2016-08-01 DIAGNOSIS — Z124 Encounter for screening for malignant neoplasm of cervix: Secondary | ICD-10-CM | POA: Diagnosis not present

## 2016-08-01 DIAGNOSIS — R61 Generalized hyperhidrosis: Secondary | ICD-10-CM | POA: Diagnosis not present

## 2016-08-01 DIAGNOSIS — Z01419 Encounter for gynecological examination (general) (routine) without abnormal findings: Secondary | ICD-10-CM | POA: Diagnosis not present

## 2016-08-01 DIAGNOSIS — Z6833 Body mass index (BMI) 33.0-33.9, adult: Secondary | ICD-10-CM | POA: Diagnosis not present

## 2016-08-02 ENCOUNTER — Other Ambulatory Visit: Payer: Self-pay | Admitting: Obstetrics and Gynecology

## 2016-08-02 DIAGNOSIS — Z1231 Encounter for screening mammogram for malignant neoplasm of breast: Secondary | ICD-10-CM

## 2016-08-03 DIAGNOSIS — Z6833 Body mass index (BMI) 33.0-33.9, adult: Secondary | ICD-10-CM | POA: Diagnosis not present

## 2016-08-03 DIAGNOSIS — E538 Deficiency of other specified B group vitamins: Secondary | ICD-10-CM | POA: Diagnosis not present

## 2016-08-03 DIAGNOSIS — F5231 Female orgasmic disorder: Secondary | ICD-10-CM | POA: Diagnosis not present

## 2016-08-03 DIAGNOSIS — R61 Generalized hyperhidrosis: Secondary | ICD-10-CM | POA: Diagnosis not present

## 2016-08-03 DIAGNOSIS — F520 Hypoactive sexual desire disorder: Secondary | ICD-10-CM | POA: Diagnosis not present

## 2016-08-03 DIAGNOSIS — Z01419 Encounter for gynecological examination (general) (routine) without abnormal findings: Secondary | ICD-10-CM | POA: Diagnosis not present

## 2016-08-30 DIAGNOSIS — R87622 Low grade squamous intraepithelial lesion on cytologic smear of vagina (LGSIL): Secondary | ICD-10-CM | POA: Diagnosis not present

## 2016-08-30 DIAGNOSIS — R87612 Low grade squamous intraepithelial lesion on cytologic smear of cervix (LGSIL): Secondary | ICD-10-CM | POA: Diagnosis not present

## 2016-09-12 DIAGNOSIS — E538 Deficiency of other specified B group vitamins: Secondary | ICD-10-CM | POA: Diagnosis not present

## 2016-09-14 ENCOUNTER — Ambulatory Visit: Payer: Self-pay | Admitting: Physician Assistant

## 2016-09-14 ENCOUNTER — Encounter: Payer: Self-pay | Admitting: Physician Assistant

## 2016-09-14 VITALS — BP 120/70 | HR 82 | Temp 97.8°F | Resp 16

## 2016-09-14 DIAGNOSIS — J209 Acute bronchitis, unspecified: Secondary | ICD-10-CM

## 2016-09-14 MED ORDER — BENZONATATE 100 MG PO CAPS: 100.0000 mg | ORAL_CAPSULE | Freq: Three times a day (TID) | ORAL | 0 refills | Status: DC | PRN

## 2016-09-14 MED ORDER — AZITHROMYCIN 250 MG PO TABS: ORAL_TABLET | ORAL | 0 refills | Status: DC

## 2016-09-14 NOTE — Progress Notes (Signed)
   Subjective: URI    Patient ID: Amanda Solomon, female    DOB: 1960/06/13, 56 y.o.   MRN: [id number]  HPI Patient c/o productive cough and chest congestionfor one week. Denies fever/chill, N/V/D. States intermitting wheezing.   Review of Systems  Negative except for compliant    Objective:   Physical Exam HEENT unremarkable. Neck supple w/o adenopathy. Lungs with mild Rales/?wheezing. Heart RRR.       Assessment & Plan:Bronchitis  Zithromax and Tessalon as directed.  F/U with PCP.

## 2016-10-19 DIAGNOSIS — E538 Deficiency of other specified B group vitamins: Secondary | ICD-10-CM | POA: Diagnosis not present

## 2016-10-22 ENCOUNTER — Encounter: Payer: Self-pay | Admitting: Physician Assistant

## 2016-10-22 ENCOUNTER — Ambulatory Visit: Payer: Self-pay | Admitting: Physician Assistant

## 2016-10-22 VITALS — BP 120/80 | HR 72 | Temp 98.4°F

## 2016-10-22 DIAGNOSIS — M545 Low back pain, unspecified: Secondary | ICD-10-CM

## 2016-10-22 MED ORDER — TIZANIDINE HCL 4 MG PO CAPS: 4.0000 mg | ORAL_CAPSULE | Freq: Three times a day (TID) | ORAL | 0 refills | Status: AC

## 2016-10-22 MED ORDER — METHYLPREDNISOLONE 4 MG PO TBPK: ORAL_TABLET | ORAL | 0 refills | Status: DC

## 2016-10-22 MED ORDER — KETOROLAC TROMETHAMINE 60 MG/2ML IM SOLN
60.0000 mg | Freq: Once | INTRAMUSCULAR | Status: AC
  Administered 2016-10-22: 60 mg via INTRAMUSCULAR

## 2016-10-22 NOTE — Progress Notes (Signed)
S:  C/o low back pain for 1 -2 day, no known injury, states she bent and turned and the muscle grabbed her; pain is worse with movement, increased with bending over, denies numbness, tingling, or changes in bowel/urinary habits,  Using otc meds without relief Remainder ros neg  O:  Vitals wnl, nad, lungs c t a, cv rrr, spine nontender, muscles in lower left tender along si joint area, decreased rom with bending forward, able to twist without difficulty  Neg slr, pt walks without difficulty, no foot drop noted, n/v intact  A: acute back pain, muscle spasms  P: use wet heat followed by ice, stretches, return to clinic if not better in 3 t 5 days, return earlier if worsening, rx meds:medrol dose pack, zanaflex refill, toradol 60mg  Im given in clinic

## 2016-10-24 ENCOUNTER — Encounter: Payer: Self-pay | Admitting: Emergency Medicine

## 2016-10-24 ENCOUNTER — Emergency Department
Admission: EM | Admit: 2016-10-24 | Discharge: 2016-10-24 | Disposition: A | Discharge status: Home / Self Care | Payer: 59 | Attending: Emergency Medicine | Admitting: Emergency Medicine

## 2016-10-24 DIAGNOSIS — R109 Unspecified abdominal pain: Secondary | ICD-10-CM | POA: Diagnosis not present

## 2016-10-24 DIAGNOSIS — R1032 Left lower quadrant pain: Secondary | ICD-10-CM | POA: Diagnosis not present

## 2016-10-24 DIAGNOSIS — Z79899 Other long term (current) drug therapy: Secondary | ICD-10-CM | POA: Diagnosis not present

## 2016-10-24 LAB — URINALYSIS, COMPLETE (UACMP) WITH MICROSCOPIC
Bacteria, UA: NONE SEEN
Bilirubin Urine: NEGATIVE
Glucose, UA: NEGATIVE mg/dL
HGB URINE DIPSTICK: NEGATIVE
Ketones, ur: NEGATIVE mg/dL
Leukocytes, UA: NEGATIVE
NITRITE: NEGATIVE
PH: 5 (ref 5.0–8.0)
Protein, ur: NEGATIVE mg/dL
SPECIFIC GRAVITY, URINE: 1.012 (ref 1.005–1.030)

## 2016-10-24 LAB — BASIC METABOLIC PANEL
ANION GAP: 8 (ref 5–15)
BUN: 12 mg/dL (ref 6–20)
CO2: 25 mmol/L (ref 22–32)
Calcium: 9.1 mg/dL (ref 8.9–10.3)
Chloride: 105 mmol/L (ref 101–111)
Creatinine, Ser: 1.28 mg/dL — ABNORMAL HIGH (ref 0.44–1.00)
GFR calc Af Amer: 54 mL/min — ABNORMAL LOW (ref >60)
GFR calc non Af Amer: 47 mL/min — ABNORMAL LOW (ref >60)
GLUCOSE: 113 mg/dL — AB (ref 65–99)
POTASSIUM: 4.3 mmol/L (ref 3.5–5.1)
Sodium: 138 mmol/L (ref 135–145)

## 2016-10-24 LAB — CBC
HEMATOCRIT: 41.5 % (ref 35.0–47.0)
HEMOGLOBIN: 13.6 g/dL (ref 12.0–16.0)
MCH: 27.7 pg (ref 26.0–34.0)
MCHC: 32.8 g/dL (ref 32.0–36.0)
MCV: 84.5 fL (ref 80.0–100.0)
PLATELETS: 269 10*3/uL (ref 150–440)
RBC: 4.91 MIL/uL (ref 3.80–5.20)
RDW: 13.2 % (ref 11.5–14.5)
WBC: 9.7 10*3/uL (ref 3.6–11.0)

## 2016-10-24 MED ORDER — DICLOFENAC SODIUM 3 % TD GEL: 1.0000 "application " | Freq: Two times a day (BID) | TRANSDERMAL | 0 refills | Status: AC | PRN

## 2016-10-24 NOTE — ED Triage Notes (Signed)
Pt presents from work with left flank pain since Sunday. [facility] and was given steroids and toradol, which helped. She states that the pain started up again this morning. She has hx of bilateral kidney stones (2 years ago). Pt alert & oriented, nad noted.

## 2016-10-24 NOTE — ED Provider Notes (Signed)
[facility] Emergency Department Provider Note  ____________________________________________   First MD Initiated Contact with Patient 10/24/16 1401     (approximate)  I have reviewed the triage vital signs and the nursing notes.   HISTORY  Chief Complaint Flank Pain   HPI Amanda Solomon is a 56 y.o. female with a history of bilateral kidney stones was presenting with 4 days of left flank pain. Says the pain feels like a tightness. She says that she thinks that she pulled something in her left flank when she was working outside several days ago. Says that the pain has been ongoing since the Sunday and she has started a Medrol Dosepak as well as Zanaflex with some relief. However, she says that today the pain returned. However, after urinating in triage today the pain is now completely resolved. She said the pain with the left side and worsened with deep breathing. However, she is not having any shortness of breath at this time. Denies any nausea or vomiting or dysuria. Denies any fever or chills.   Past Medical History:  Diagnosis Date  . Anemia   . B12 deficiency   . Kidney stone   . Nephrolithiasis   . Osteoarthritis   . PONV (postoperative nausea and vomiting)     There are no active problems to display for this patient.   Past Surgical History:  Procedure Laterality Date  . ANTERIOR FUSION CERVICAL SPINE    . COLONOSCOPY WITH PROPOFOL N/A 01/14/2015   Procedure: COLONOSCOPY WITH PROPOFOL;  Surgeon: Scot Junobert T Elliott, MD;  [facility];  Service: Endoscopy;  Laterality: N/A;  . ESOPHAGOGASTRODUODENOSCOPY (EGD) WITH PROPOFOL N/A 01/14/2015   Procedure: ESOPHAGOGASTRODUODENOSCOPY (EGD) WITH PROPOFOL;  Surgeon: Scot Junobert T Elliott, MD;  [facility];  Service: Endoscopy;  Laterality: N/A;  . ESOPHAGOGASTRODUODENOSCOPY (EGD) WITH PROPOFOL N/A 04/29/2015   Procedure: ESOPHAGOGASTRODUODENOSCOPY (EGD) WITH PROPOFOL;  Surgeon: Scot Junobert T  Elliott, MD;  [facility];  Service: Endoscopy;  Laterality: N/A;  . Incision and drainage  10/2012   Finger abscess  . INCISION AND DRAINAGE / EXCISION THYROGLOSSAL CYST    . THYROGLOSSAL DUCT CYST      Prior to Admission medications   Medication Sig Start Date End Date Taking? Authorizing Provider  albuterol (PROVENTIL HFA;VENTOLIN HFA) 108 (90 Base) MCG/ACT inhaler Inhale 2 puffs into the lungs every 6 (six) hours as needed for wheezing or shortness of breath. 07/16/16   Sherrie MustacheFisher, Roselyn BeringSusan W, PA-C  azithromycin (ZITHROMAX Z-PAK) 250 MG tablet 2 pills today then 1 pill a day for 4 days Patient not taking: Reported on 07/16/2016 07/10/16   Faythe GheeFisher, Susan W, PA-C  azithromycin [facility](ZITHROMAX) 250 MG tablet Take 2 tablet day 1; then one tablet daily 09/14/16   Joni ReiningSmith, Ronald K, PA-C  benzonatate (TESSALON PERLES) 100 MG capsule Take 1 capsule (100 mg total) by mouth 3 (three) times daily as needed for cough. 09/14/16   Joni ReiningSmith, Ronald K, PA-C  chlorpheniramine-HYDROcodone (TUSSIONEX PENNKINETIC ER) 10-8 MG/5ML SUER Take 5 mLs by mouth every 12 (twelve) hours as needed for cough. Patient not taking: Reported on 09/14/2016 07/16/16   Faythe GheeFisher, Susan W, PA-C  Cyanocobalamin (VITAMIN B 12 PO) Take by mouth.    [provider]  ferrous sulfate 325 (65 FE) MG EC tablet Take 325 mg by mouth 3 (three) times daily with meals.    [provider]  fluticasone (FLONASE) 50 MCG/ACT nasal spray Place 2 sprays into both nostrils daily. Patient not taking: Reported  on 09/14/2016 07/10/16   Faythe Ghee, PA-C  methylPREDNISolone (MEDROL DOSEPAK) 4 MG TBPK tablet Take 6 pills on day one then decrease by 1 pill each day 10/22/16   Faythe Ghee, PA-C  omeprazole (PRILOSEC) 40 MG capsule Take 40 mg by mouth daily.    [provider]  progesterone (PROMETRIUM) 100 MG capsule Take 100 mg by mouth daily.    [provider]  tiZANidine (ZANAFLEX) 4 MG capsule Take 1 capsule (4 mg total)  by mouth 3 (three) times daily. 10/22/16   Faythe Ghee, PA-C    Allergies Patient has no known allergies.  Family History  Problem Relation Age of Onset  . Breast cancer Maternal Aunt   . Breast cancer Maternal Grandmother     Social History Social History  Substance Use Topics  . Smoking status: Never Smoker  . Smokeless tobacco: Never Used  . Alcohol use No     Comment: rarely    Review of Systems  Constitutional: No fever/chills Eyes: No visual changes. ENT: No sore throat. Cardiovascular: Denies chest pain. Respiratory: Denies shortness of breath. Gastrointestinal: No abdominal pain.  No nausea, no vomiting.  No diarrhea.  No constipation. Genitourinary: Negative for dysuria. Musculoskeletal: as above Skin: Negative for rash. Neurological: Negative for headaches, focal weakness or numbness.   ____________________________________________   PHYSICAL EXAM:  VITAL SIGNS: ED Triage Vitals  Enc Vitals Group     BP 10/24/16 1155 128/82     Pulse Rate 10/24/16 1155 67     Resp 10/24/16 1155 16     Temp 10/24/16 1155 98 F (36.7 C)     Temp Source 10/24/16 1155 Oral     SpO2 10/24/16 1155 96 %     Weight 10/24/16 1156 190 lb (86.2 kg)     Height 10/24/16 1156 5\' 3"  (1.6 m)     Head Circumference --      Peak Flow --      Pain Score 10/24/16 1219 7     Pain Loc --      Pain Edu? --      Excl. in GC? --     Constitutional: Alert and oriented. Well appearing and in no acute distress. Eyes: Conjunctivae are normal.  Head: Atraumatic. Nose: No congestion/rhinnorhea. Mouth/Throat: Mucous membranes are moist.  Neck: No stridor.   Cardiovascular: Normal rate, regular rhythm. Grossly normal heart sounds.   Respiratory: Normal respiratory effort.  No retractions. Lungs CTAB. Gastrointestinal: Soft and nontender. No distention. No CVA tenderness. No tenderness to the low lumbar region. Musculoskeletal: No lower extremity tenderness nor edema.  No joint  effusions. Neurologic:  Normal speech and language. No gross focal neurologic deficits are appreciated. Skin:  Skin is warm, dry and intact. No rash noted. Psychiatric: Mood and affect are normal. Speech and behavior are normal.  ____________________________________________   LABS (all labs ordered are listed, but only abnormal results are displayed)  Labs Reviewed  URINALYSIS, COMPLETE (UACMP) WITH MICROSCOPIC - Abnormal; Notable for the following:       Result Value   Color, Urine YELLOW (*)    APPearance HAZY (*)    Squamous Epithelial / LPF 6-30 (*)    All other components within normal limits  BASIC METABOLIC PANEL - Abnormal; Notable for the following:    Glucose, Bld 113 (*)    Creatinine, Ser 1.28 (*)    GFR calc non Af Amer 47 (*)    GFR calc Af Amer 54 (*)  All other components within normal limits  CBC   ____________________________________________  EKG   ____________________________________________  RADIOLOGY   ____________________________________________   PROCEDURES  Procedure(s) performed:   Procedures  Critical Care performed:   ____________________________________________   INITIAL IMPRESSION / ASSESSMENT AND PLAN / ED COURSE  Pertinent labs & imaging results that were available during my care of the patient were reviewed by me and considered in my medical decision making (see chart for details).  BX, kidney stone, strained a muscle, shingles  As part of my medical decision making, I reviewed the following data within the electronic MEDICAL RECORD NUMBERPrevious visits  Patient with likely muscle strain. Recommended the patient use topical treatment such as Aspercreme MIC hot and heating pad. She is understanding what to comply. Will be discharged home at this time. Will also provide a prescription for diclofenac gel. Patient's symptoms are completely resolved and her labs are very reassuring. Less likely to be kidney stone because of no blood  in the urine.      ____________________________________________   FINAL CLINICAL IMPRESSION(S) / ED DIAGNOSES  Left flank pain.    NEW MEDICATIONS STARTED DURING THIS VISIT:  New Prescriptions   No medications on file     Note:  This document was prepared using Dragon voice recognition software and may include unintentional dictation errors.     Myrna Blazer, MD 10/24/16 [phone]

## 2016-11-19 DIAGNOSIS — E538 Deficiency of other specified B group vitamins: Secondary | ICD-10-CM | POA: Diagnosis not present

## 2016-12-07 DIAGNOSIS — E538 Deficiency of other specified B group vitamins: Secondary | ICD-10-CM | POA: Diagnosis not present

## 2016-12-07 DIAGNOSIS — Z0000 Encounter for general adult medical examination without abnormal findings: Secondary | ICD-10-CM | POA: Diagnosis not present

## 2016-12-07 DIAGNOSIS — D500 Iron deficiency anemia secondary to blood loss (chronic): Secondary | ICD-10-CM | POA: Diagnosis not present

## 2016-12-14 DIAGNOSIS — Z8719 Personal history of other diseases of the digestive system: Secondary | ICD-10-CM | POA: Diagnosis not present

## 2016-12-14 DIAGNOSIS — D500 Iron deficiency anemia secondary to blood loss (chronic): Secondary | ICD-10-CM | POA: Diagnosis not present

## 2016-12-14 DIAGNOSIS — M1991 Primary osteoarthritis, unspecified site: Secondary | ICD-10-CM | POA: Diagnosis not present

## 2016-12-14 DIAGNOSIS — E538 Deficiency of other specified B group vitamins: Secondary | ICD-10-CM | POA: Diagnosis not present

## 2016-12-19 DIAGNOSIS — M65311 Trigger thumb, right thumb: Secondary | ICD-10-CM | POA: Diagnosis not present

## 2016-12-21 DIAGNOSIS — E538 Deficiency of other specified B group vitamins: Secondary | ICD-10-CM | POA: Diagnosis not present

## 2017-01-21 ENCOUNTER — Ambulatory Visit
Admission: RE | Admit: 2017-01-21 | Discharge: 2017-01-21 | Disposition: A | Discharge status: Home / Self Care | Payer: 59 | Source: Ambulatory Visit | Attending: Obstetrics and Gynecology | Admitting: Obstetrics and Gynecology

## 2017-01-21 ENCOUNTER — Other Ambulatory Visit: Payer: Self-pay | Admitting: Obstetrics and Gynecology

## 2017-01-21 DIAGNOSIS — E538 Deficiency of other specified B group vitamins: Secondary | ICD-10-CM | POA: Diagnosis not present

## 2017-01-21 DIAGNOSIS — Z1231 Encounter for screening mammogram for malignant neoplasm of breast: Secondary | ICD-10-CM | POA: Diagnosis not present

## 2017-02-21 DIAGNOSIS — E538 Deficiency of other specified B group vitamins: Secondary | ICD-10-CM | POA: Diagnosis not present

## 2017-03-12 DIAGNOSIS — R87612 Low grade squamous intraepithelial lesion on cytologic smear of cervix (LGSIL): Secondary | ICD-10-CM | POA: Diagnosis not present

## 2017-03-26 DIAGNOSIS — E538 Deficiency of other specified B group vitamins: Secondary | ICD-10-CM | POA: Diagnosis not present

## 2017-04-29 DIAGNOSIS — E538 Deficiency of other specified B group vitamins: Secondary | ICD-10-CM | POA: Diagnosis not present

## 2017-06-03 DIAGNOSIS — E538 Deficiency of other specified B group vitamins: Secondary | ICD-10-CM | POA: Diagnosis not present

## 2017-06-18 DIAGNOSIS — H5203 Hypermetropia, bilateral: Secondary | ICD-10-CM | POA: Diagnosis not present

## 2017-07-03 ENCOUNTER — Ambulatory Visit: Payer: 59 | Attending: Anesthesiology | Admitting: Anesthesiology

## 2017-07-03 ENCOUNTER — Other Ambulatory Visit: Payer: Self-pay

## 2017-07-03 ENCOUNTER — Encounter: Payer: Self-pay | Admitting: Anesthesiology

## 2017-07-03 VITALS — BP 113/68 | HR 65 | Temp 98.1°F | Ht 64.0 in | Wt 190.0 lb

## 2017-07-03 DIAGNOSIS — Z7951 Long term (current) use of inhaled steroids: Secondary | ICD-10-CM | POA: Insufficient documentation

## 2017-07-03 DIAGNOSIS — M5386 Other specified dorsopathies, lumbar region: Secondary | ICD-10-CM | POA: Diagnosis not present

## 2017-07-03 DIAGNOSIS — Z79899 Other long term (current) drug therapy: Secondary | ICD-10-CM | POA: Diagnosis not present

## 2017-07-03 DIAGNOSIS — M545 Low back pain: Secondary | ICD-10-CM | POA: Insufficient documentation

## 2017-07-03 DIAGNOSIS — M47816 Spondylosis without myelopathy or radiculopathy, lumbar region: Secondary | ICD-10-CM | POA: Diagnosis not present

## 2017-07-03 NOTE — Progress Notes (Signed)
Subjective:  Patient ID: Amanda Solomon, female    DOB: 1960-07-10  Age: 57 y.o. MRN: [id number]  CC: No chief complaint on file.   Procedure: None  HPI Amanda Solomon presents for reevaluation.  She was last seen 2 years ago.  She has long-standing history of low back pain and spasming in the lumbar region.  In the past she has been on Robaxin and this is worked effectively for her.  She takes this periodically for her low back spasming.  She has been doing some stretching strengthening exercises for her core and there is some to work well.  Unfortunately she has continued to have the same quality characteristic and low back muscle spasming.  No change in lower extremity strength or function or bowel or bladder function are noted.  The Robaxin she takes yields minimal side effects but does help with the spasming and helps her get through her workday.  She is also been on Zanaflex but this made her too sleepy and groggy.  Outpatient Medications Prior to Visit  Medication Sig Dispense Refill  . albuterol (PROVENTIL HFA;VENTOLIN HFA) 108 (90 Base) MCG/ACT inhaler Inhale 2 puffs into the lungs every 6 (six) hours as needed for wheezing or shortness of breath. 1 Inhaler 0  . azithromycin (ZITHROMAX Z-PAK) 250 MG tablet 2 pills today then 1 pill a day for 4 days 6 each 0  . azithromycin (ZITHROMAX) 250 MG tablet Take 2 tablet day 1; then one tablet daily 6 tablet 0  . benzonatate (TESSALON PERLES) 100 MG capsule Take 1 capsule (100 mg total) by mouth 3 (three) times daily as needed for cough. 15 capsule 0  . chlorpheniramine-HYDROcodone (TUSSIONEX PENNKINETIC ER) 10-8 MG/5ML SUER Take 5 mLs by mouth every 12 (twelve) hours as needed for cough. 150 mL 0  . Cyanocobalamin (VITAMIN B 12 PO) Take by mouth.    . Diclofenac Sodium 3 % GEL Place 1 application onto the skin every 12 (twelve) hours as needed. 50 g 0  . ferrous sulfate 325 (65 FE) MG EC tablet Take 325 mg by mouth 3 (three) times daily  with meals.    . fluticasone (FLONASE) 50 MCG/ACT nasal spray Place 2 sprays into both nostrils daily. 16 g 6  . methylPREDNISolone (MEDROL DOSEPAK) 4 MG TBPK tablet Take 6 pills on day one then decrease by 1 pill each day 21 tablet 0  . omeprazole (PRILOSEC) 40 MG capsule Take 40 mg by mouth daily.    . progesterone (PROMETRIUM) 100 MG capsule Take 100 mg by mouth daily.    Marland Kitchen tiZANidine (ZANAFLEX) 4 MG capsule Take 1 capsule (4 mg total) by mouth 3 (three) times daily. 30 capsule 0   Facility-Administered Medications Prior to Visit  Medication Dose Route Frequency Provider Last Rate Last Dose  . dexamethasone (DECADRON) injection 10 mg  10 mg Other Once Yevette Edwards, MD      . ipratropium-albuterol (DUONEB) 0.5-2.5 (3) MG/3ML nebulizer solution 3 mL  3 mL Nebulization Once Fisher, Roselyn Bering, PA-C      . ropivacaine (PF) 2 mg/ml (0.2%) (NAROPIN) epidural 10 mL  10 mL Epidural Once Yevette Edwards, MD        Review of Systems CNS: No confusion or sedation Cardiac: No angina or palpitations GI: No abdominal pain or constipation Constitutional: No nausea vomiting fevers or chills  Objective:  BP 113/68   Pulse 65   Temp 98.1 F (36.7 C)   Ht 5\' 4"  (  1.626 m)   Wt 190 lb (86.2 kg)   LMP 11/03/2014   SpO2 100%   BMI 32.61 kg/m    BP Readings from Last 3 Encounters:  07/03/17 113/68  10/24/16 132/79  10/22/16 120/80     Wt Readings from Last 3 Encounters:  07/03/17 190 lb (86.2 kg)  10/24/16 190 lb (86.2 kg)  10/24/15 180 lb (81.6 kg)     Physical Exam Pt is alert and oriented PERRL EOMI HEART IS RRR no murmur or rub LCTA no wheezing or rales MUSCULOSKELETAL reveals some paraspinous muscle tenderness in the low lumbar region and over the left and right SI region.  Her muscle tone and bulk is good and strength appears good she is ambulating well  Labs  No results found for: HGBA1C Lab Results  Component Value Date   CREATININE 1.28 (H) 10/24/2016     -------------------------------------------------------------------------------------------------------------------- Lab Results  Component Value Date   WBC 9.7 10/24/2016   HGB 13.6 10/24/2016   HCT 41.5 10/24/2016   PLT 269 10/24/2016   GLUCOSE 113 (H) 10/24/2016   ALT 21 09/20/2014   AST 23 09/20/2014   NA 138 10/24/2016   K 4.3 10/24/2016   CL 105 10/24/2016   CREATININE 1.28 (H) 10/24/2016   BUN 12 10/24/2016   CO2 25 10/24/2016    --------------------------------------------------------------------------------------------------------------------- Mm Screening Breast Tomo Bilateral  Result Date: 01/21/2017 CLINICAL DATA:  Screening. EXAM: 2D DIGITAL SCREENING BILATERAL MAMMOGRAM WITH 3D TOMO WITH CAD COMPARISON:  Previous exam(s). ACR Breast Density Category c: The breast tissue is heterogeneously dense, which may obscure small masses. FINDINGS: There are no findings suspicious for malignancy. Images were processed with CAD. IMPRESSION: No mammographic evidence of malignancy. A result letter of this screening mammogram will be mailed directly to the patient. RECOMMENDATION: Screening mammogram in one year. (Code:SM-B-01Y) BI-RADS CATEGORY  1: Negative. Electronically Signed   By: Frederico Hamman M.D.   On: 01/21/2017 13:50     Assessment & Plan:   Diagnoses and all orders for this visit:  Low back derangement syndrome  Facet arthritis of lumbar region        ----------------------------------------------------------------------------------------------------------------------  Problem List Items Addressed This Visit    None    Visit Diagnoses    Low back derangement syndrome    -  Primary   Facet arthritis of lumbar region            ----------------------------------------------------------------------------------------------------------------------  1. Low back derangement syndrome We will refill her Robaxin and keep her on this for now.  I think  this is reasonable for long-term management.  Continue core stretching strengthening exercises and have gone over a few stretches to help with her SI symptoms and her lumbar paraspinous muscle symptoms.  2. Facet arthritis of lumbar region As above with efforts at core strengthening at weight loss.  Return to clinic in approximately 6 months for reevaluation.    ----------------------------------------------------------------------------------------------------------------------  I am having Celeste A. Serio maintain her omeprazole, ferrous sulfate, Cyanocobalamin (VITAMIN B 12 PO), progesterone, azithromycin, fluticasone, albuterol, chlorpheniramine-HYDROcodone, azithromycin, benzonatate, tiZANidine, methylPREDNISolone, and Diclofenac Sodium. We will continue to administer ropivacaine (PF) 2 mg/mL (0.2%), dexamethasone, and ipratropium-albuterol.   No orders of the defined types were placed in this encounter.  Patient's Medications  New Prescriptions   No medications on file  Previous Medications   ALBUTEROL (PROVENTIL HFA;VENTOLIN HFA) 108 (90 BASE) MCG/ACT INHALER    Inhale 2 puffs into the lungs every 6 (six) hours as needed for wheezing or shortness  of breath.   AZITHROMYCIN (ZITHROMAX Z-PAK) 250 MG TABLET    2 pills today then 1 pill a day for 4 days   AZITHROMYCIN (ZITHROMAX) 250 MG TABLET    Take 2 tablet day 1; then one tablet daily   BENZONATATE (TESSALON PERLES) 100 MG CAPSULE    Take 1 capsule (100 mg total) by mouth 3 (three) times daily as needed for cough.   CHLORPHENIRAMINE-HYDROCODONE (TUSSIONEX PENNKINETIC ER) 10-8 MG/5ML SUER    Take 5 mLs by mouth every 12 (twelve) hours as needed for cough.   CYANOCOBALAMIN (VITAMIN B 12 PO)    Take by mouth.   DICLOFENAC SODIUM 3 % GEL    Place 1 application onto the skin every 12 (twelve) hours as needed.   FERROUS SULFATE 325 (65 FE) MG EC TABLET    Take 325 mg by mouth 3 (three) times daily with meals.   FLUTICASONE (FLONASE) 50  MCG/ACT NASAL SPRAY    Place 2 sprays into both nostrils daily.   METHYLPREDNISOLONE (MEDROL DOSEPAK) 4 MG TBPK TABLET    Take 6 pills on day one then decrease by 1 pill each day   OMEPRAZOLE (PRILOSEC) 40 MG CAPSULE    Take 40 mg by mouth daily.   PROGESTERONE (PROMETRIUM) 100 MG CAPSULE    Take 100 mg by mouth daily.   TIZANIDINE (ZANAFLEX) 4 MG CAPSULE    Take 1 capsule (4 mg total) by mouth 3 (three) times daily.  Modified Medications   No medications on file  Discontinued Medications   No medications on file   ----------------------------------------------------------------------------------------------------------------------  Follow-up: Return in about 6 months (around 01/03/2018) for evaluation, med refill.    Yevette EdwardsJames G Adams, MD

## 2017-07-09 DIAGNOSIS — E538 Deficiency of other specified B group vitamins: Secondary | ICD-10-CM | POA: Diagnosis not present

## 2017-08-15 DIAGNOSIS — E538 Deficiency of other specified B group vitamins: Secondary | ICD-10-CM | POA: Diagnosis not present

## 2017-08-30 ENCOUNTER — Other Ambulatory Visit: Payer: Self-pay | Admitting: Obstetrics and Gynecology

## 2017-08-30 DIAGNOSIS — Z01419 Encounter for gynecological examination (general) (routine) without abnormal findings: Secondary | ICD-10-CM | POA: Diagnosis not present

## 2017-08-30 DIAGNOSIS — F5231 Female orgasmic disorder: Secondary | ICD-10-CM | POA: Diagnosis not present

## 2017-08-30 DIAGNOSIS — N898 Other specified noninflammatory disorders of vagina: Secondary | ICD-10-CM | POA: Diagnosis not present

## 2017-08-30 DIAGNOSIS — N760 Acute vaginitis: Secondary | ICD-10-CM | POA: Diagnosis not present

## 2017-08-30 DIAGNOSIS — Z1231 Encounter for screening mammogram for malignant neoplasm of breast: Secondary | ICD-10-CM

## 2017-08-30 DIAGNOSIS — F520 Hypoactive sexual desire disorder: Secondary | ICD-10-CM | POA: Diagnosis not present

## 2017-09-13 DIAGNOSIS — F520 Hypoactive sexual desire disorder: Secondary | ICD-10-CM | POA: Diagnosis not present

## 2017-09-16 DIAGNOSIS — E538 Deficiency of other specified B group vitamins: Secondary | ICD-10-CM | POA: Diagnosis not present

## 2017-09-20 DIAGNOSIS — F520 Hypoactive sexual desire disorder: Secondary | ICD-10-CM | POA: Diagnosis not present

## 2017-10-11 DIAGNOSIS — F520 Hypoactive sexual desire disorder: Secondary | ICD-10-CM | POA: Diagnosis not present

## 2017-10-14 ENCOUNTER — Other Ambulatory Visit: Payer: Self-pay

## 2017-10-14 ENCOUNTER — Ambulatory Visit (INDEPENDENT_AMBULATORY_CARE_PROVIDER_SITE_OTHER): Payer: Self-pay | Admitting: Adult Health

## 2017-10-14 ENCOUNTER — Emergency Department
Admission: EM | Admit: 2017-10-14 | Discharge: 2017-10-14 | Disposition: A | Discharge status: Home / Self Care | Payer: 59 | Attending: Emergency Medicine | Admitting: Emergency Medicine

## 2017-10-14 ENCOUNTER — Encounter: Payer: Self-pay | Admitting: Emergency Medicine

## 2017-10-14 VITALS — BP 140/82 | HR 66 | Temp 97.8°F | Wt 200.0 lb

## 2017-10-14 DIAGNOSIS — M545 Low back pain, unspecified: Secondary | ICD-10-CM

## 2017-10-14 DIAGNOSIS — Z79899 Other long term (current) drug therapy: Secondary | ICD-10-CM | POA: Insufficient documentation

## 2017-10-14 DIAGNOSIS — M1990 Unspecified osteoarthritis, unspecified site: Secondary | ICD-10-CM | POA: Insufficient documentation

## 2017-10-14 DIAGNOSIS — S39011A Strain of muscle, fascia and tendon of abdomen, initial encounter: Secondary | ICD-10-CM | POA: Diagnosis not present

## 2017-10-14 DIAGNOSIS — M5489 Other dorsalgia: Secondary | ICD-10-CM | POA: Diagnosis present

## 2017-10-14 DIAGNOSIS — M62830 Muscle spasm of back: Secondary | ICD-10-CM | POA: Insufficient documentation

## 2017-10-14 DIAGNOSIS — M549 Dorsalgia, unspecified: Secondary | ICD-10-CM

## 2017-10-14 DIAGNOSIS — T148XXA Other injury of unspecified body region, initial encounter: Secondary | ICD-10-CM

## 2017-10-14 MED ORDER — ORPHENADRINE CITRATE 30 MG/ML IJ SOLN
60.0000 mg | Freq: Two times a day (BID) | INTRAMUSCULAR | Status: DC
  Administered 2017-10-14: 60 mg via INTRAMUSCULAR
  Filled 2017-10-14: qty 2

## 2017-10-14 MED ORDER — ORPHENADRINE CITRATE ER 100 MG PO TB12: 100.0000 mg | ORAL_TABLET | Freq: Two times a day (BID) | ORAL | 0 refills | Status: DC

## 2017-10-14 MED ORDER — KETOROLAC TROMETHAMINE 60 MG/2ML IM SOLN
60.0000 mg | Freq: Once | INTRAMUSCULAR | Status: AC
  Administered 2017-10-14: 60 mg via INTRAMUSCULAR
  Filled 2017-10-14: qty 2

## 2017-10-14 NOTE — Patient Instructions (Signed)

## 2017-10-14 NOTE — ED Provider Notes (Signed)
[facility] Emergency Department Provider Note   ____________________________________________   First MD Initiated Contact with Patient 10/14/17 1040     (approximate)  I have reviewed the triage vital signs and the nursing notes.   HISTORY  Chief Complaint Back Pain    HPI Amanda Solomon is a 57 y.o. female patient complain of left flank pain which occurred secondary to a twisting incident yesterday.  Patient denies radicular component to her back pain.  Patient denies bladder bowel dysfunction.  Incident occurred as she was getting out of her vehicle.  Patient was seen by PA who believes she might have a kidney stone or sciatica.  Patient rates the pain 7/10.  Patient described pain is "spasmatic/achy".  Patient denies hematuria.   Past Medical History:  Diagnosis Date  . Anemia   . B12 deficiency   . Kidney stone   . Nephrolithiasis   . Osteoarthritis   . PONV (postoperative nausea and vomiting)     Patient Active Problem List   Diagnosis Date Noted  . Osteoarthritis 10/14/2017  . History of gastric ulcer 01/19/2015  . Iron deficiency anemia due to chronic blood loss 11/19/2014  . Incidental lung nodule 11/02/2014  . Nephrolithiasis 10/02/2014    Past Surgical History:  Procedure Laterality Date  . ANTERIOR FUSION CERVICAL SPINE    . COLONOSCOPY WITH PROPOFOL N/A 01/14/2015   Procedure: COLONOSCOPY WITH PROPOFOL;  Surgeon: Scot Jun, MD;  Location[facility] ENDOSCOPY;  Service: Endoscopy;  Laterality: N/A;  . ESOPHAGOGASTRODUODENOSCOPY (EGD) WITH PROPOFOL N/A 01/14/2015   Procedure: ESOPHAGOGASTRODUODENOSCOPY (EGD) WITH PROPOFOL;  Surgeon: Scot Jun, MD;  Location: [facility] ENDOSCOPY;  Service: Endoscopy;  Laterality: N/A;  . ESOPHAGOGASTRODUODENOSCOPY (EGD) WITH PROPOFOL N/A 04/29/2015   Procedure: ESOPHAGOGASTRODUODENOSCOPY (EGD) WITH PROPOFOL;  Surgeon: Scot Jun, MD;  Location: [facility] ENDOSCOPY;  Service: Endoscopy;   Laterality: N/A;  . Incision and drainage  10/2012   Finger abscess  . INCISION AND DRAINAGE / EXCISION THYROGLOSSAL CYST    . THYROGLOSSAL DUCT CYST      Prior to Admission medications   Medication Sig Start Date End Date Taking? Authorizing Provider  benzonatate (TESSALON PERLES) 100 MG capsule Take 1 capsule (100 mg total) by mouth 3 (three) times daily as needed for cough. 09/14/16   Joni Reining, PA-C  Cyanocobalamin (VITAMIN B 12 PO) Take by mouth.    [provider]  Diclofenac Sodium 3 % GEL Place 1 application onto the skin every 12 (twelve) hours as needed. 10/24/16   Schaevitz, Myra Rude, MD  ferrous sulfate 325 (65 FE) MG EC tablet Take 325 mg by mouth 3 (three) times daily with meals.    [provider]  omeprazole (PRILOSEC) 40 MG capsule Take 40 mg by mouth daily.    [provider]  orphenadrine (NORFLEX) 100 MG tablet Take 1 tablet (100 mg total) by mouth 2 (two) times daily. 10/14/17   Joni Reining, PA-C  progesterone (PROMETRIUM) 100 MG capsule Take 100 mg by mouth daily.    [provider]  testosterone cypionate (DEPOTESTOSTERONE CYPIONATE) 200 MG/ML injection  09/06/17   [provider]  tiZANidine (ZANAFLEX) 4 MG capsule Take 1 capsule (4 mg total) by mouth 3 (three) times daily. 10/22/16   Faythe Ghee, PA-C    Allergies Patient has no known allergies.  Family History  Problem Relation Age of Onset  . Breast cancer Maternal Aunt   . Breast cancer Maternal Grandmother  Social History Social History   Tobacco Use  . Smoking status: Never Smoker  . Smokeless tobacco: Never Used  Substance Use Topics  . Alcohol use: No    Comment: rarely  . Drug use: No    Review of Systems Constitutional: No fever/chills Eyes: No visual changes. ENT: No sore throat. Cardiovascular: Denies chest pain. Respiratory: Denies shortness of breath. Gastrointestinal: No abdominal pain.  No nausea, no vomiting.  No  diarrhea.  No constipation. Genitourinary: Negative for dysuria. Musculoskeletal: Left flank pain. Skin: Negative for rash. Neurological: Negative for headaches, focal weakness or numbness.   ____________________________________________   PHYSICAL EXAM:  VITAL SIGNS: ED Triage Vitals  Enc Vitals Group     BP 10/14/17 0953 127/61     Pulse Rate 10/14/17 0953 65     Resp 10/14/17 0953 18     Temp 10/14/17 0953 97.6 F (36.4 C)     Temp Source 10/14/17 0953 Oral     SpO2 10/14/17 0953 98 %     Weight 10/14/17 0954 197 lb (89.4 kg)     Height 10/14/17 0954 5\' 4"  (1.626 m)     Head Circumference --      Peak Flow --      Pain Score 10/14/17 0954 7     Pain Loc --      Pain Edu? --      Excl. in GC? --    Constitutional: Alert and oriented. Well appearing and in no acute distress. Cardiovascular: Normal rate, regular rhythm. Grossly normal heart sounds.  Good peripheral circulation. Respiratory: Normal respiratory effort.  No retractions. Lungs CTAB. Gastrointestinal: Soft and nontender. No distention. No abdominal bruits. No CVA tenderness. Genitourinary: Deferred Musculoskeletal: No obvious spinal deformity.  Patient decreased range of motion with right lateral movements with pain referred to the left side.  Patient had negative straight leg test bilaterally in supine position.. Neurologic:  Normal speech and language. No gross focal neurologic deficits are appreciated. No gait instability. Skin:  Skin is warm, dry and intact. No rash noted. Psychiatric: Mood and affect are normal. Speech and behavior are normal.  ____________________________________________   LABS (all labs ordered are listed, but only abnormal results are displayed)  Labs Reviewed - No data to display ____________________________________________  EKG   ____________________________________________  RADIOLOGY  ED MD interpretation:    Official radiology report(s): No results  found.  ____________________________________________   PROCEDURES  Procedure(s) performed: None  Procedures  Critical Care performed: No  ____________________________________________   INITIAL IMPRESSION / ASSESSMENT AND PLAN / ED COURSE  As part of my medical decision making, I reviewed the following data within the electronic MEDICAL RECORD NUMBER    Left flank pain secondary to strain.  Patient given discharge care instruction advised take medication as directed.  Patient given a work note and will follow-up PCP if condition persist.      ____________________________________________   FINAL CLINICAL IMPRESSION(S) / ED DIAGNOSES  Final diagnoses:  Muscle strain     ED Discharge Orders         Ordered    orphenadrine (NORFLEX) 100 MG tablet  2 times daily     10/14/17 1050           Note:  This document was prepared using Dragon voice recognition software and may include unintentional dictation errors.    Joni Reining, PA-C 10/14/17 1057    Governor Rooks, MD 10/14/17 [phone]

## 2017-10-14 NOTE — ED Notes (Signed)
See triage note  Presents with pain to left flank/lower back area   States  she developed pain when she turned to reach something

## 2017-10-14 NOTE — Progress Notes (Addendum)
Subjective:     Patient ID: Amanda Solomon, female   DOB: [birth year], 57 y.o.   MRN: [id number]   Blood pressure 140/82, pulse 66, temperature 97.8 F (36.6 C), weight 200 lb (90.7 kg), last menstrual period 11/03/2014, SpO2 98 %. HPI Patient is a 57 year old female in no acute distress who comes to the clinic with pain left lower - mid back. She reports she turned yesterday to get something out of the car and feels maybe she pulled a muscle.  She took 500 mg Robaxin last night and had no relief.  She has history of kidney stones. She reports she had bilateral kidney stones with the same symptoms.   Denies any urgency/ frequency or hematuria.  Hurts to move and rollover. She wtried ice pack last night and soaked in tub last night.  She denies any radiculopathy.   She does not take NSAIDS due to stomach ulcer.  She has not taken any tylenol for pain.   She also has Zanaflex at home.   She has history of low back derangement syndrome and history of bilateral kidney stones.  She reports she has oxycodone at home but did not take it as she felt it was her muscle.   Mild nausea.   Patient  denies any fever, body aches,chills, rash, chest pain, shortness of breath, vomiting, or diarrhea.   Review of Systems  Constitutional: Negative for activity change, appetite change, chills, diaphoresis, fatigue, fever and unexpected weight change.  HENT: Negative.   Respiratory: Negative.   Cardiovascular: Negative.   Gastrointestinal: Negative.   Genitourinary: Positive for flank pain. Negative for decreased urine volume, difficulty urinating, dyspareunia, dysuria, enuresis, frequency, genital sores, hematuria, menstrual problem, pelvic pain, urgency, vaginal bleeding, vaginal discharge and vaginal pain.  Musculoskeletal: Positive for back pain.  Skin: Negative.   Allergic/Immunologic: Negative.   Neurological: Negative.   Psychiatric/Behavioral: Negative.        Objective:   Physical Exam   Constitutional: She is oriented to person, place, and time. She appears well-developed and well-nourished.  Patient is alert and oriented and responsive to questions Engages in eye contact with provider. Speaks in full sentences without any pauses without any shortness of breath or distress.    HENT:  Head: Normocephalic and atraumatic.  Eyes: Pupils are equal, round, and reactive to light. Conjunctivae and EOM are normal.  Neck: Normal range of motion. Neck supple.  Cardiovascular: Normal rate, regular rhythm, normal heart sounds and intact distal pulses. Exam reveals no gallop and no friction rub.  No murmur heard. Pulmonary/Chest: Effort normal and breath sounds normal. No stridor. No respiratory distress. She has no wheezes. She has no rales. She exhibits no tenderness.  Abdominal: Soft. Bowel sounds are normal. There is no tenderness. There is CVA tenderness (left side " very tender" on exam ). There is no rigidity, no rebound, no guarding, no tenderness at McBurney's point and negative Murphy's sign.  Musculoskeletal: Normal range of motion.       Lumbar back: Normal.       Back:  Guarded range of motion. Slow movement.  Denies any known injury. Left CVA tenderness     Neurological: She is alert and oriented to person, place, and time.  Skin: Skin is warm and dry. Capillary refill takes less than 2 seconds.  Psychiatric: She has a normal mood and affect. Her behavior is normal. Judgment and thought content normal.  Vitals reviewed.      Assessment:  Costovertebral angle tenderness  Acute left-sided low back pain without sciatica       Plan:     Patient declined giving urine sample.  Discussed with patient she needs higher level of care she is referred to emergency room for possible imaging given 8/10 back pain not relieved with Robaxin, guarded movement to rule out differential diagnoses of repeat kidney stones versus worsening back pain etiology versus muscle spasm.      Patient also aware to follow up with her Lynnea Ferrier, MD PCP.   She agrees to go to the emergency room now  for evaluation or [place] urgent care where imaging is available.   No imaging available/ lab work at [facility] provider recommends she needs higher level of care needs higher level of care  Advised patient call the office or your primary care doctor for an appointment if no improvement within 72 hours or if any symptoms change or worsen at any time  Advised ER or urgent Care if after hours or on weekend. Call 911 for emergency symptoms at any time.Patinet verbalized understanding of all instructions given/reviewed and treatment plan and has no further questions or concerns at this time.    Patient verbalized understanding of all instructions given and denies any further questions at this time. She requested note saying she was seen in this clinic- given.

## 2017-10-14 NOTE — Discharge Instructions (Signed)
Follow discharge care instruction continue Norflex as directed.

## 2017-10-14 NOTE — ED Triage Notes (Signed)
Low back pain radiating to R hip. Denies dysuria. States twisted yesterday and had pain suddenly after.

## 2017-10-30 DIAGNOSIS — F520 Hypoactive sexual desire disorder: Secondary | ICD-10-CM | POA: Diagnosis not present

## 2017-12-09 DIAGNOSIS — M1991 Primary osteoarthritis, unspecified site: Secondary | ICD-10-CM | POA: Diagnosis not present

## 2017-12-09 DIAGNOSIS — D500 Iron deficiency anemia secondary to blood loss (chronic): Secondary | ICD-10-CM | POA: Diagnosis not present

## 2017-12-09 DIAGNOSIS — Z0000 Encounter for general adult medical examination without abnormal findings: Secondary | ICD-10-CM | POA: Diagnosis not present

## 2017-12-09 DIAGNOSIS — E538 Deficiency of other specified B group vitamins: Secondary | ICD-10-CM | POA: Diagnosis not present

## 2017-12-16 DIAGNOSIS — E538 Deficiency of other specified B group vitamins: Secondary | ICD-10-CM | POA: Diagnosis not present

## 2017-12-16 DIAGNOSIS — Z0000 Encounter for general adult medical examination without abnormal findings: Secondary | ICD-10-CM | POA: Diagnosis not present

## 2017-12-16 DIAGNOSIS — N200 Calculus of kidney: Secondary | ICD-10-CM | POA: Diagnosis not present

## 2017-12-16 DIAGNOSIS — F520 Hypoactive sexual desire disorder: Secondary | ICD-10-CM | POA: Diagnosis not present

## 2017-12-16 DIAGNOSIS — M1991 Primary osteoarthritis, unspecified site: Secondary | ICD-10-CM | POA: Diagnosis not present

## 2017-12-30 ENCOUNTER — Encounter: Payer: Self-pay | Admitting: Physician Assistant

## 2017-12-30 ENCOUNTER — Ambulatory Visit (INDEPENDENT_AMBULATORY_CARE_PROVIDER_SITE_OTHER): Payer: Self-pay | Admitting: Physician Assistant

## 2017-12-30 VITALS — BP 130/90 | HR 86 | Temp 98.2°F | Resp 14 | Wt 203.0 lb

## 2017-12-30 DIAGNOSIS — J40 Bronchitis, not specified as acute or chronic: Secondary | ICD-10-CM

## 2017-12-30 MED ORDER — ALBUTEROL SULFATE HFA 108 (90 BASE) MCG/ACT IN AERS: 2.0000 | INHALATION_SPRAY | RESPIRATORY_TRACT | 0 refills | Status: AC | PRN

## 2017-12-30 MED ORDER — PREDNISONE 50 MG PO TABS: 50.0000 mg | ORAL_TABLET | Freq: Every day | ORAL | 0 refills | Status: AC

## 2017-12-30 MED ORDER — PSEUDOEPH-BROMPHEN-DM 30-2-10 MG/5ML PO SYRP: 5.0000 mL | ORAL_SOLUTION | Freq: Four times a day (QID) | ORAL | 0 refills | Status: AC | PRN

## 2017-12-30 NOTE — Progress Notes (Signed)
Patient ID: Amanda GimenezCharlotte A Solomon DOB: 1960-12-04 AGE: 57 y.o. MRN: [id number]   PCP: Amanda FerrierKlein, Bert J III, MD   Chief Complaint:  Chief Complaint  Patient presents with  . cough and congestion    x3days  . sinus pressure    sudafed     Subjective:    HPI:  Amanda GimenezCharlotte A Solomon is a 57 y.o. female presents for evaluation  Chief Complaint  Patient presents with  . cough and congestion    x3days  . sinus pressure    sudafed   57 year old female [facility] with five day history of URI symptoms. Began with bilateral eye pressure; patient states this is her typical symptom for when she has a fever. Took temperature with thermometer, no fever. Then developed right ear pain, nasal congestion, postnasal drip, and cough. Cough coarse sounding. Associated chest congestion. Cough not productive. Associated wheezing; only at night, when patient lies flat trying to sleep. Has taken OTC Sudafed with minimal symptom relief. Denies headache, ear discharge/drainage, tinnitus, sinus pain, sore throat, chest pain, SOB, nausea/vomiting, body aches. Patient denies asthma history. States she has previously been prescribed an albuterol inhaler for bronchitis. Last episode of bronchitis 07/16/2016, [facility], resolved with Medrol Dose Pak, albuterol inhaler, and Tussionex. Patient denies smoking history (tobacco and e-cigarettes). No DM history. Patient with GERD, on PPI, denies recent flare-up.   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  A limited review of symptoms was performed, pertinent positives and negatives as mentioned in HPI.  The following portions of the patient's history were reviewed and updated as appropriate: allergies, current medications and past medical history.  Patient Active Problem List   Diagnosis Date Noted  . Osteoarthritis 10/14/2017  .  History of gastric ulcer 01/19/2015  . Iron deficiency anemia due to chronic blood loss 11/19/2014  . Incidental lung nodule 11/02/2014  . Nephrolithiasis 10/02/2014    No Known Allergies  Current Outpatient Medications on File Prior to Visit  Medication Sig Dispense Refill  . Cyanocobalamin (VITAMIN B 12 PO) Take by mouth.    . Diclofenac Sodium 3 % GEL Place 1 application onto the skin every 12 (twelve) hours as needed. 50 g 0  . ferrous sulfate 325 (65 FE) MG EC tablet Take 325 mg by mouth 3 (three) times daily with meals.    Marland Kitchen. omeprazole (PRILOSEC) 40 MG capsule Take 40 mg by mouth daily.    Marland Kitchen. testosterone cypionate (DEPOTESTOSTERONE CYPIONATE) 200 MG/ML injection   0  . tiZANidine (ZANAFLEX) 4 MG capsule Take 1 capsule (4 mg total) by mouth 3 (three) times daily. 30 capsule 0   No current facility-administered medications on file prior to visit.        Objective:   Vitals:   12/30/17 0950  BP: 130/90  Pulse: 86  Resp: 14  Temp: 98.2 F (36.8 C)  SpO2: 99%     Wt Readings from Last 3 Encounters:  12/30/17 203 lb (92.1 kg)  10/14/17 197 lb (89.4 kg)  10/14/17 200 lb (90.7 kg)    Physical Exam:   General Appearance:  Patient sitting comfortably on examination table. Conversational. Peri JeffersonGood self-historian. In no acute distress. Afebrile.   Head:  Normocephalic, without obvious abnormality, atraumatic  Eyes:  PERRL, conjunctiva/corneas clear, EOM's intact  Ears:  Bilateral ear canals WNL. No erythema or edema. No discharge/drainage. Bilateral  TMs WNL. No erythema, injection, or serous effusion. No scar tissue.  Nose: Nares normal, septum midline. Nasal mucosa with bilateral edema/bogginess. Clear rhinorrhea. Nasally/congested sounding voice. No sinus tenderness with percussion/palpation.  Throat: Lips, mucosa, and tongue normal; teeth and gums normal. Throat reveals no erythema. Tonsils with no enlargement or exudate.  Neck: Supple, symmetrical, trachea midline, no  adenopathy  Lungs:   Clear to auscultation bilaterally, respirations unlabored. No wheezing. No wheezing with forced expiration. Coarse/bronchitic cough during examination. No audible rales or rhonchi.  Heart:  Regular rate and rhythm, S1 and S2 normal, no murmur, rub, or gallop  Extremities: Extremities normal, atraumatic, no cyanosis or edema  Pulses: 2+ and symmetric  Skin: Skin color, texture, turgor normal, no rashes or lesions  Lymph nodes: Cervical, supraclavicular, and axillary nodes normal  Neurologic: Normal    Assessment & Plan:    Exam findings, diagnosis etiology and medication use and indications reviewed with patient. Follow-Up and discharge instructions provided. No emergent/urgent issues found on exam.  Patient education was provided.   Patient verbalized understanding of information provided and agrees with plan of care (POC), all questions answered. The patient is advised to call or return to clinic if condition does not see an improvement in symptoms, or to seek the care of the closest emergency department if condition worsens with the below plan.    1. Bronchitis  - predniSONE (DELTASONE) 50 MG tablet; Take 1 tablet (50 mg total) by mouth daily with breakfast for 5 days.  Dispense: 5 tablet; Refill: 0 - albuterol (PROVENTIL HFA;VENTOLIN HFA) 108 (90 Base) MCG/ACT inhaler; Inhale 2 puffs into the lungs every 4 (four) hours as needed.  Dispense: 1 Inhaler; Refill: 0 - brompheniramine-pseudoephedrine-DM 30-2-10 MG/5ML syrup; Take 5 mLs by mouth 4 (four) times daily as needed.  Dispense: 120 mL; Refill: 0  Patient with five day history of URI symptoms with worsening cough/chest congestion. HPI and PE suggestive of bronchitis. Patient's VSS, afebrile, in no acute distress, and clear lung sounds. Will treat patient with 5-day course of prednisone 50mg , albuterol inhaler, and Bromfed cough syrup. Advised increase fluids, rest, and humidifier in bedroom. Gave patient work excuse  note. Advised patient follow-up with PCP or urgent care in 4-5 days if not improving, sooner with any worsening symptoms.   Janalyn HarderSamantha Donnice Nielsen, MHS, PA-C Rulon SeraSamantha F. Janaa Acero, MHS, PA-C Advanced Practice Provider [facility]  [facility]  [address], [facility], 1st Floor [place], [place] [address] (p):  [phone] [email or web address]

## 2017-12-30 NOTE — Patient Instructions (Signed)
Thank you for choosing [facility] for your health care needs.  You have been diagnosed with bronchitis.  Recommend you increase fluids; water, Gatorade, hot tea with lemon/honey. Rest. Use several pillows to prop yourself up at night. May use cool mist humidifier in bedroom.  You have been prescribed 5-day course of prednisone (steroid). Take 1 pill by mouth once a day. You have been prescribed an albuterol inhaler. Use 2 puffs every 4 hours for cough/wheezing. You have been prescribed a cough syrup.  Follow-up with family physician or urgent care in 4-5 days if symptoms not improving, sooner with any worsening symptoms.  Hope you feel better soon!  Acute Bronchitis, Adult Acute bronchitis is when air tubes (bronchi) in the lungs suddenly get swollen. The condition can make it hard to breathe. It can also cause these symptoms:  A cough.  Coughing up clear, yellow, or green mucus.  Wheezing.  Chest congestion.  Shortness of breath.  A fever.  Body aches.  Chills.  A sore throat. Follow these instructions at home:  Medicines  Take over-the-counter and prescription medicines only as told by your doctor.  If you were prescribed an antibiotic medicine, take it as told by your doctor. Do not stop taking the antibiotic even if you start to feel better. General instructions  Rest.  Drink enough fluids to keep your pee (urine) pale yellow.  Avoid smoking and secondhand smoke. If you smoke and you need help quitting, ask your doctor. Quitting will help your lungs heal faster.  Use an inhaler, cool mist vaporizer, or humidifier as told by your doctor.  Keep all follow-up visits as told by your doctor. This is important. How is this prevented? To lower your risk of getting this condition again:  Wash your hands often with soap and water. If you cannot use soap and water, use hand sanitizer.  Avoid contact with people who have cold symptoms.  Try not to touch your  hands to your mouth, nose, or eyes.  Make sure to get the flu shot every year. Contact a doctor if:  Your symptoms do not get better in 2 weeks. Get help right away if:  You cough up blood.  You have chest pain.  You have very bad shortness of breath.  You become dehydrated.  You faint (pass out) or keep feeling like you are going to pass out.  You keep throwing up (vomiting).  You have a very bad headache.  Your fever or chills gets worse. This information is not intended to replace advice given to you by your health care provider. Make sure you discuss any questions you have with your health care provider. Document Released: 06/06/2007 Document Revised: 08/01/2016 Document Reviewed: 06/08/2015 Elsevier Interactive Patient Education  2019 [employer]

## 2018-01-02 ENCOUNTER — Telehealth: Payer: Self-pay | Admitting: Emergency Medicine

## 2018-01-02 NOTE — Telephone Encounter (Signed)
Left message following up on visit with [facility] 

## 2018-01-17 ENCOUNTER — Ambulatory Visit
Admission: RE | Admit: 2018-01-17 | Discharge: 2018-01-17 | Disposition: A | Discharge status: Home / Self Care | Payer: 59 | Source: Ambulatory Visit | Attending: Obstetrics and Gynecology | Admitting: Obstetrics and Gynecology

## 2018-01-17 DIAGNOSIS — Z1231 Encounter for screening mammogram for malignant neoplasm of breast: Secondary | ICD-10-CM | POA: Insufficient documentation

## 2018-03-11 DIAGNOSIS — F520 Hypoactive sexual desire disorder: Secondary | ICD-10-CM | POA: Diagnosis not present

## 2018-07-18 DIAGNOSIS — H5203 Hypermetropia, bilateral: Secondary | ICD-10-CM | POA: Diagnosis not present

## 2018-12-12 DIAGNOSIS — Z0000 Encounter for general adult medical examination without abnormal findings: Secondary | ICD-10-CM | POA: Diagnosis not present

## 2018-12-12 DIAGNOSIS — D500 Iron deficiency anemia secondary to blood loss (chronic): Secondary | ICD-10-CM | POA: Diagnosis not present

## 2018-12-12 DIAGNOSIS — E538 Deficiency of other specified B group vitamins: Secondary | ICD-10-CM | POA: Diagnosis not present

## 2018-12-19 DIAGNOSIS — N200 Calculus of kidney: Secondary | ICD-10-CM | POA: Diagnosis not present

## 2018-12-19 DIAGNOSIS — M1991 Primary osteoarthritis, unspecified site: Secondary | ICD-10-CM | POA: Diagnosis not present

## 2018-12-19 DIAGNOSIS — D500 Iron deficiency anemia secondary to blood loss (chronic): Secondary | ICD-10-CM | POA: Diagnosis not present

## 2018-12-19 DIAGNOSIS — E538 Deficiency of other specified B group vitamins: Secondary | ICD-10-CM | POA: Diagnosis not present

## 2018-12-19 DIAGNOSIS — Z0000 Encounter for general adult medical examination without abnormal findings: Secondary | ICD-10-CM | POA: Diagnosis not present

## 2019-05-20 DIAGNOSIS — S93312A Subluxation of tarsal joint of left foot, initial encounter: Secondary | ICD-10-CM | POA: Diagnosis not present

## 2019-05-20 DIAGNOSIS — M79672 Pain in left foot: Secondary | ICD-10-CM | POA: Diagnosis not present

## 2019-06-03 DIAGNOSIS — M79672 Pain in left foot: Secondary | ICD-10-CM | POA: Diagnosis not present

## 2019-06-03 DIAGNOSIS — S93312A Subluxation of tarsal joint of left foot, initial encounter: Secondary | ICD-10-CM | POA: Diagnosis not present

## 2019-06-17 DIAGNOSIS — E538 Deficiency of other specified B group vitamins: Secondary | ICD-10-CM | POA: Diagnosis not present

## 2019-06-17 DIAGNOSIS — Z13220 Encounter for screening for lipoid disorders: Secondary | ICD-10-CM | POA: Diagnosis not present

## 2019-06-17 DIAGNOSIS — R739 Hyperglycemia, unspecified: Secondary | ICD-10-CM | POA: Diagnosis not present

## 2019-06-17 DIAGNOSIS — D500 Iron deficiency anemia secondary to blood loss (chronic): Secondary | ICD-10-CM | POA: Diagnosis not present

## 2019-06-19 DIAGNOSIS — R739 Hyperglycemia, unspecified: Secondary | ICD-10-CM | POA: Diagnosis not present

## 2019-06-19 DIAGNOSIS — E538 Deficiency of other specified B group vitamins: Secondary | ICD-10-CM | POA: Diagnosis not present

## 2019-06-19 DIAGNOSIS — R911 Solitary pulmonary nodule: Secondary | ICD-10-CM | POA: Diagnosis not present

## 2019-06-19 DIAGNOSIS — D500 Iron deficiency anemia secondary to blood loss (chronic): Secondary | ICD-10-CM | POA: Diagnosis not present

## 2019-06-22 DIAGNOSIS — S93312A Subluxation of tarsal joint of left foot, initial encounter: Secondary | ICD-10-CM | POA: Diagnosis not present

## 2019-06-22 DIAGNOSIS — M79672 Pain in left foot: Secondary | ICD-10-CM | POA: Diagnosis not present

## 2019-09-18 IMAGING — MG DIGITAL SCREENING BILATERAL MAMMOGRAM WITH TOMO AND CAD
7 series · 8 of 23 positions shown · non-contrast
Comparison: Previous exam(s).

CLINICAL DATA: Screening.

EXAM:
DIGITAL SCREENING BILATERAL MAMMOGRAM WITH TOMO AND CAD

[R MLO synth-2D]
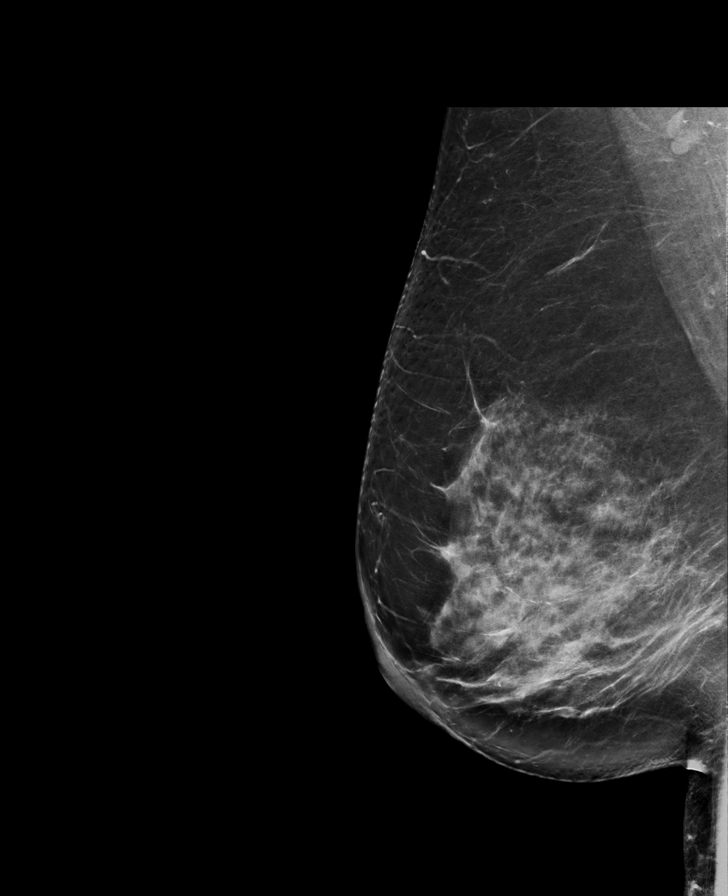

[L MLO synth-2D]
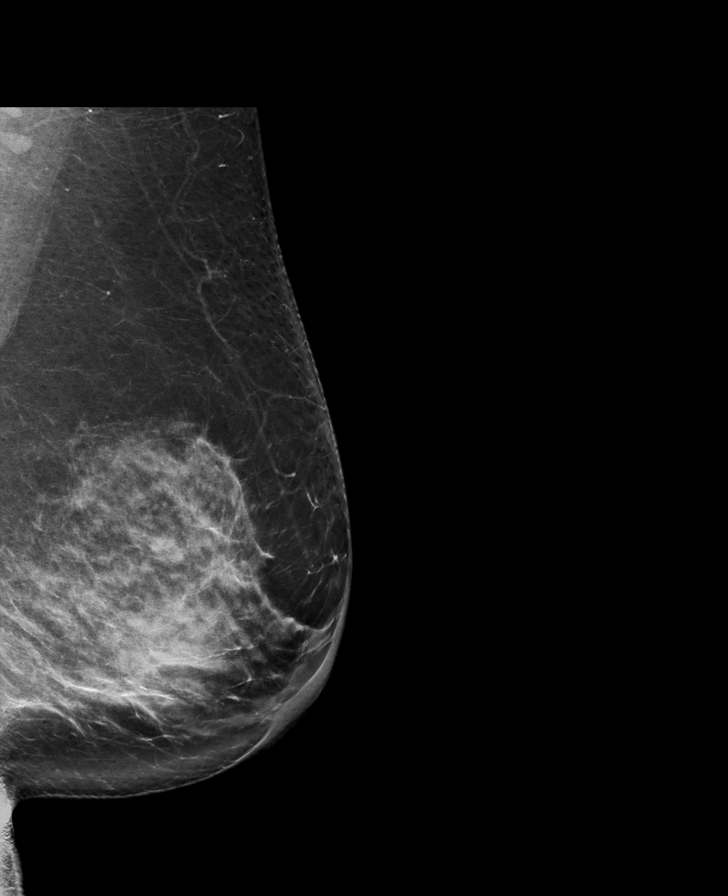

[R CC synth-2D]
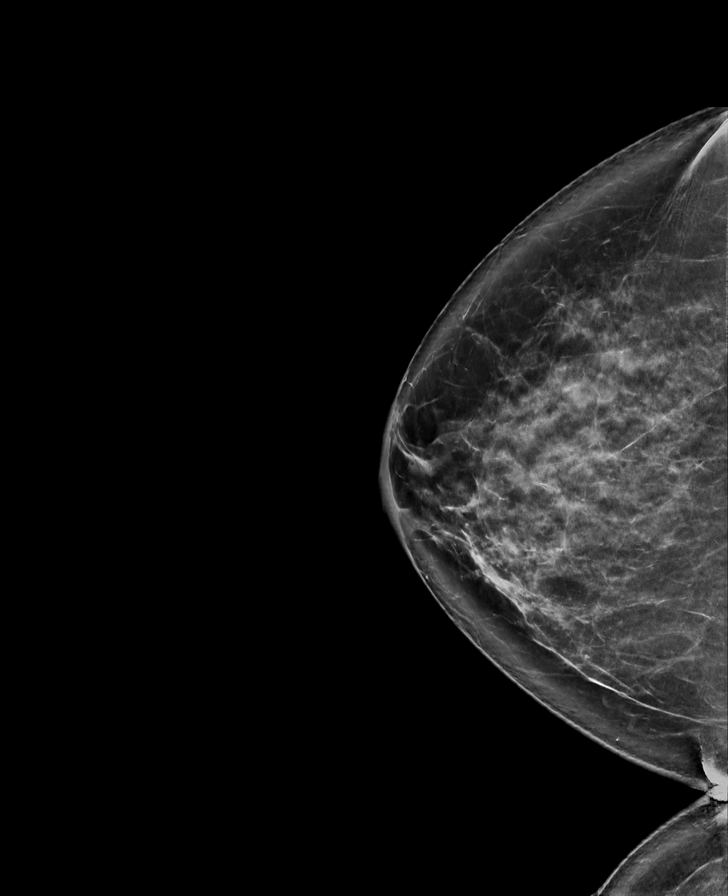

[L CC tomo · 2 of 97 frames shown]
[frame 32/97]
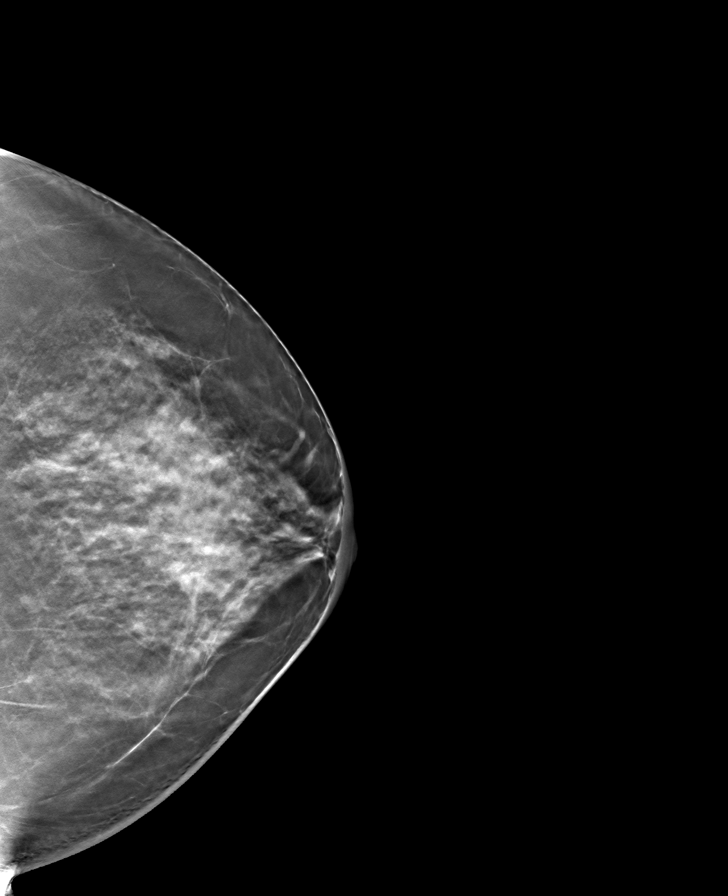
[frame 49/97]
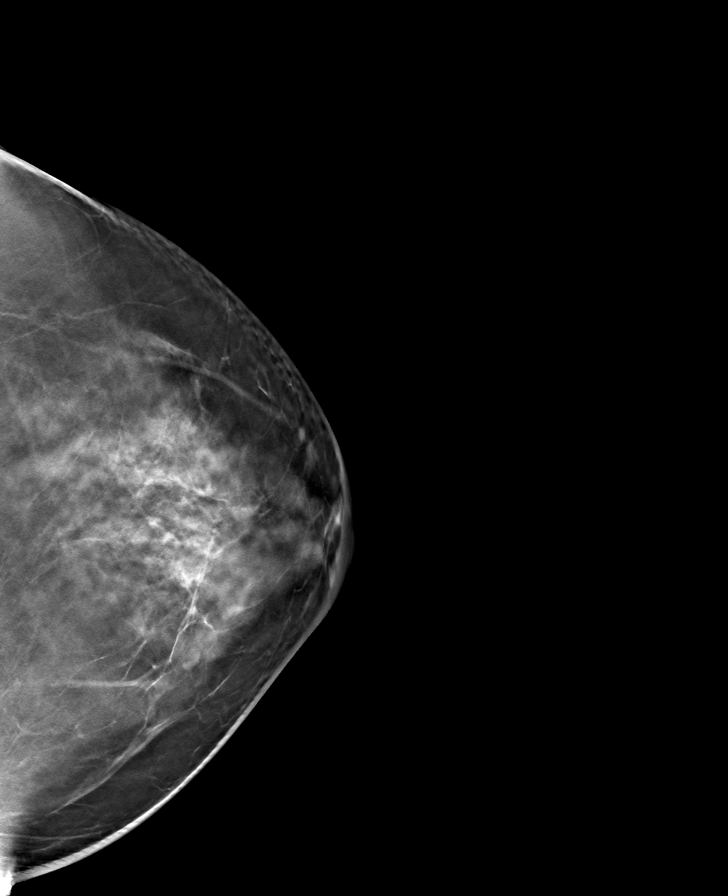

[L MLO tomo · tomo slice 50/99.0]
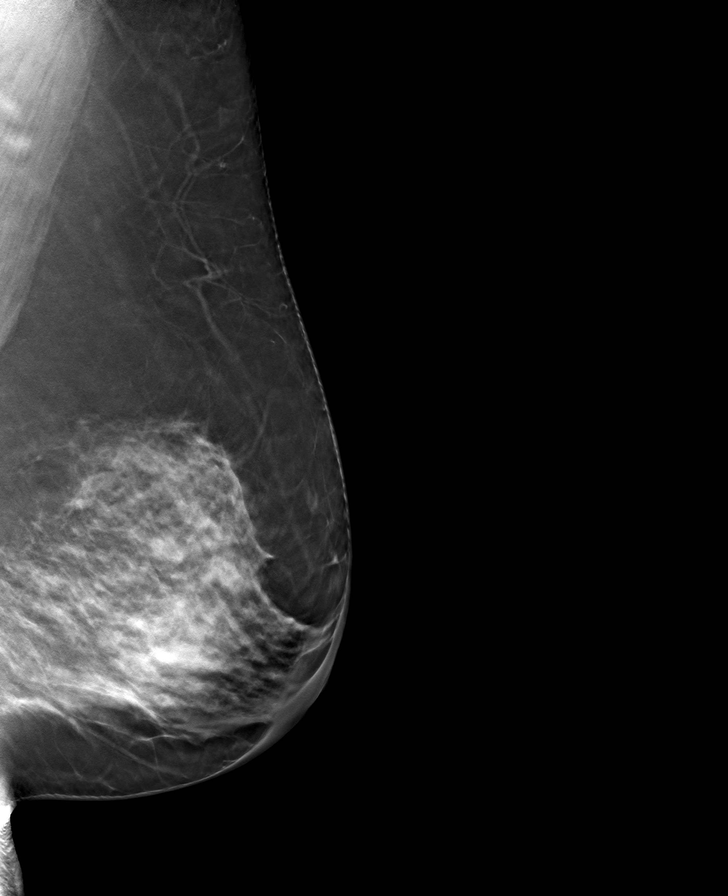

[R CC tomo · tomo slice 46/91.0]
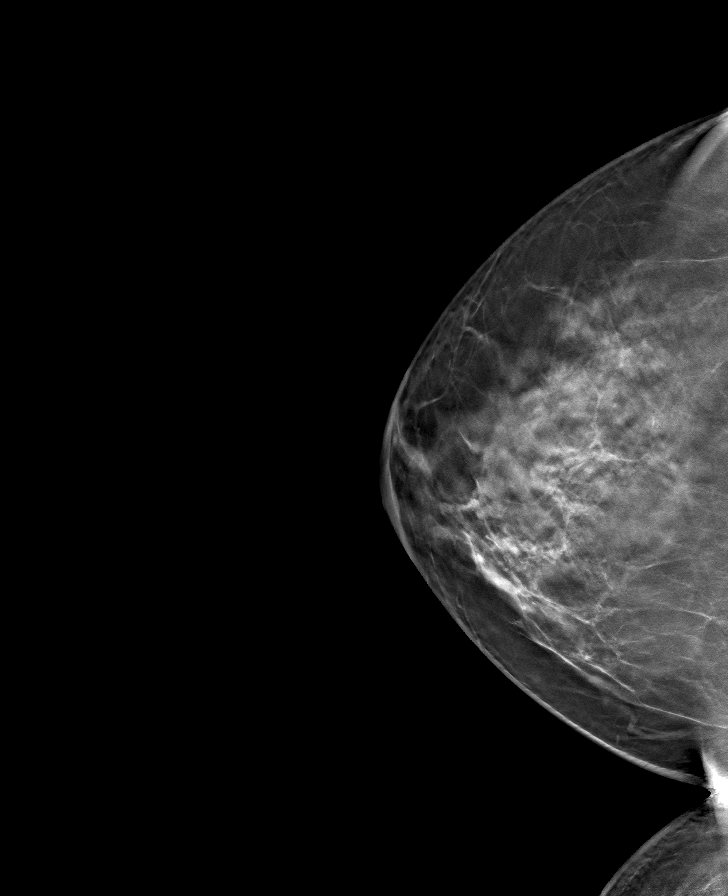

[R MLO tomo · tomo slice 49/96.0]
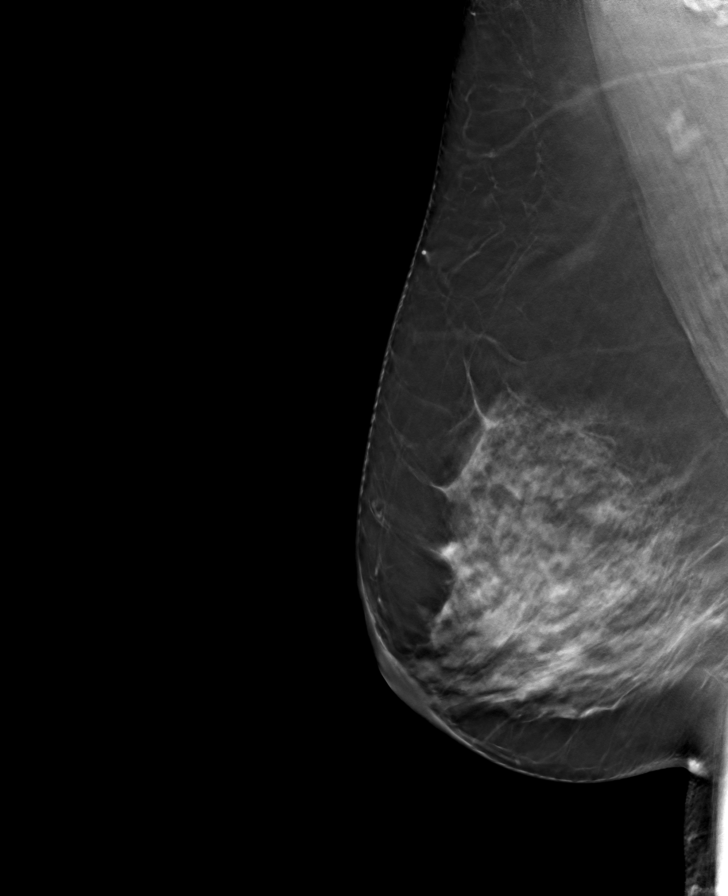

[8 of 23 positions shown; findings below may reference images not displayed]

ACR Breast Density Category c: The breast tissue is heterogeneously
dense, which may obscure small masses.
FINDINGS: There are no findings suspicious for malignancy. Images were
processed with CAD.
IMPRESSION: No mammographic evidence of malignancy. A result letter of this
screening mammogram will be mailed directly to the patient.

RECOMMENDATION:
Screening mammogram in one year. (Code:FT-U-LHB)

BI-RADS CATEGORY  1: Negative.

## 2019-11-05 ENCOUNTER — Other Ambulatory Visit: Payer: Self-pay | Admitting: Dentistry

## 2019-12-07 ENCOUNTER — Other Ambulatory Visit: Payer: Self-pay | Admitting: Physician Assistant

## 2019-12-07 DIAGNOSIS — R52 Pain, unspecified: Secondary | ICD-10-CM | POA: Diagnosis not present

## 2019-12-14 DIAGNOSIS — E785 Hyperlipidemia, unspecified: Secondary | ICD-10-CM | POA: Diagnosis not present

## 2019-12-14 DIAGNOSIS — R739 Hyperglycemia, unspecified: Secondary | ICD-10-CM | POA: Diagnosis not present

## 2019-12-14 DIAGNOSIS — E538 Deficiency of other specified B group vitamins: Secondary | ICD-10-CM | POA: Diagnosis not present

## 2019-12-21 ENCOUNTER — Other Ambulatory Visit: Payer: Self-pay | Admitting: Internal Medicine

## 2019-12-21 DIAGNOSIS — D500 Iron deficiency anemia secondary to blood loss (chronic): Secondary | ICD-10-CM | POA: Diagnosis not present

## 2019-12-21 DIAGNOSIS — Z0000 Encounter for general adult medical examination without abnormal findings: Secondary | ICD-10-CM | POA: Diagnosis not present

## 2019-12-21 DIAGNOSIS — E538 Deficiency of other specified B group vitamins: Secondary | ICD-10-CM | POA: Diagnosis not present

## 2019-12-21 DIAGNOSIS — N200 Calculus of kidney: Secondary | ICD-10-CM | POA: Diagnosis not present

## 2019-12-29 ENCOUNTER — Other Ambulatory Visit: Payer: Self-pay | Admitting: Internal Medicine

## 2019-12-29 DIAGNOSIS — R52 Pain, unspecified: Secondary | ICD-10-CM | POA: Diagnosis not present

## 2019-12-29 DIAGNOSIS — M545 Low back pain, unspecified: Secondary | ICD-10-CM | POA: Diagnosis not present

## 2019-12-29 DIAGNOSIS — R109 Unspecified abdominal pain: Secondary | ICD-10-CM | POA: Diagnosis not present

## 2019-12-29 DIAGNOSIS — K5900 Constipation, unspecified: Secondary | ICD-10-CM | POA: Diagnosis not present

## 2019-12-29 DIAGNOSIS — D500 Iron deficiency anemia secondary to blood loss (chronic): Secondary | ICD-10-CM | POA: Diagnosis not present

## 2020-01-15 ENCOUNTER — Other Ambulatory Visit: Payer: Self-pay | Admitting: Internal Medicine

## 2020-02-04 ENCOUNTER — Other Ambulatory Visit: Payer: Self-pay | Admitting: Dentistry

## 2020-03-30 ENCOUNTER — Other Ambulatory Visit: Payer: Self-pay | Admitting: Internal Medicine

## 2020-04-02 ENCOUNTER — Other Ambulatory Visit: Payer: Self-pay

## 2020-04-02 MED FILL — Estradiol Vaginal Cream 0.1 MG/GM: VAGINAL | 10 days supply | Qty: 42.5 | Fill #0 | Status: AC

## 2020-04-28 DIAGNOSIS — M722 Plantar fascial fibromatosis: Secondary | ICD-10-CM | POA: Diagnosis not present

## 2020-04-28 DIAGNOSIS — M79671 Pain in right foot: Secondary | ICD-10-CM | POA: Diagnosis not present

## 2020-04-29 DIAGNOSIS — M722 Plantar fascial fibromatosis: Secondary | ICD-10-CM | POA: Diagnosis not present

## 2020-05-03 ENCOUNTER — Other Ambulatory Visit: Payer: Self-pay

## 2020-05-03 MED FILL — Metformin HCl Tab ER 24HR 500 MG: ORAL | 30 days supply | Qty: 30 | Fill #0 | Status: AC

## 2020-05-17 ENCOUNTER — Other Ambulatory Visit: Payer: Self-pay

## 2020-05-17 DIAGNOSIS — M722 Plantar fascial fibromatosis: Secondary | ICD-10-CM | POA: Diagnosis not present

## 2020-05-17 DIAGNOSIS — M7751 Other enthesopathy of right foot: Secondary | ICD-10-CM | POA: Diagnosis not present

## 2020-05-17 DIAGNOSIS — M79671 Pain in right foot: Secondary | ICD-10-CM | POA: Diagnosis not present

## 2020-05-17 MED ORDER — MELOXICAM 15 MG PO TABS
ORAL_TABLET | ORAL | 1 refills | Status: AC
  Filled 2020-05-17: qty 30, 30d supply, fill #0

## 2020-06-13 DIAGNOSIS — E785 Hyperlipidemia, unspecified: Secondary | ICD-10-CM | POA: Diagnosis not present

## 2020-06-13 DIAGNOSIS — R739 Hyperglycemia, unspecified: Secondary | ICD-10-CM | POA: Diagnosis not present

## 2020-06-13 DIAGNOSIS — E538 Deficiency of other specified B group vitamins: Secondary | ICD-10-CM | POA: Diagnosis not present

## 2020-06-13 DIAGNOSIS — D500 Iron deficiency anemia secondary to blood loss (chronic): Secondary | ICD-10-CM | POA: Diagnosis not present

## 2020-06-15 ENCOUNTER — Other Ambulatory Visit: Payer: Self-pay

## 2020-06-15 MED FILL — Metformin HCl Tab ER 24HR 500 MG: ORAL | 30 days supply | Qty: 30 | Fill #1 | Status: AC

## 2020-06-15 MED FILL — Cyanocobalamin Inj 1000 MCG/ML: INTRAMUSCULAR | 90 days supply | Qty: 3 | Fill #0 | Status: AC

## 2020-06-20 DIAGNOSIS — D500 Iron deficiency anemia secondary to blood loss (chronic): Secondary | ICD-10-CM | POA: Diagnosis not present

## 2020-06-20 DIAGNOSIS — N200 Calculus of kidney: Secondary | ICD-10-CM | POA: Diagnosis not present

## 2020-06-20 DIAGNOSIS — E538 Deficiency of other specified B group vitamins: Secondary | ICD-10-CM | POA: Diagnosis not present

## 2020-06-20 DIAGNOSIS — M1991 Primary osteoarthritis, unspecified site: Secondary | ICD-10-CM | POA: Diagnosis not present

## 2020-10-06 ENCOUNTER — Other Ambulatory Visit: Payer: Self-pay

## 2020-10-06 MED ORDER — CHLORHEXIDINE GLUCONATE 0.12 % MT SOLN
OROMUCOSAL | 0 refills | Status: AC
  Filled 2020-10-06: qty 473, 30d supply, fill #0

## 2020-10-25 ENCOUNTER — Other Ambulatory Visit: Payer: Self-pay

## 2020-10-25 DIAGNOSIS — E669 Obesity, unspecified: Secondary | ICD-10-CM | POA: Diagnosis not present

## 2020-10-25 MED ORDER — PHENTERMINE HCL 37.5 MG PO TABS
ORAL_TABLET | ORAL | 0 refills | Status: DC
  Filled 2020-10-25: qty 30, 30d supply, fill #0

## 2020-10-26 ENCOUNTER — Other Ambulatory Visit (HOSPITAL_BASED_OUTPATIENT_CLINIC_OR_DEPARTMENT_OTHER): Payer: Self-pay

## 2020-10-26 ENCOUNTER — Other Ambulatory Visit: Payer: Self-pay

## 2020-10-28 DIAGNOSIS — E669 Obesity, unspecified: Secondary | ICD-10-CM | POA: Diagnosis not present

## 2020-11-03 ENCOUNTER — Other Ambulatory Visit: Payer: Self-pay

## 2020-11-03 MED FILL — Metformin HCl Tab ER 24HR 500 MG: ORAL | 30 days supply | Qty: 30 | Fill #2 | Status: AC

## 2020-11-04 ENCOUNTER — Other Ambulatory Visit: Payer: Self-pay

## 2020-11-15 ENCOUNTER — Other Ambulatory Visit: Payer: Self-pay

## 2020-11-15 DIAGNOSIS — E669 Obesity, unspecified: Secondary | ICD-10-CM | POA: Diagnosis not present

## 2020-11-15 MED ORDER — PHENTERMINE HCL 37.5 MG PO TABS
ORAL_TABLET | ORAL | 0 refills | Status: DC
  Filled 2020-11-15 – 2020-11-21 (×4): qty 30, 30d supply, fill #0

## 2020-11-18 ENCOUNTER — Other Ambulatory Visit: Payer: Self-pay

## 2020-11-21 ENCOUNTER — Other Ambulatory Visit: Payer: Self-pay

## 2020-12-15 ENCOUNTER — Other Ambulatory Visit: Payer: Self-pay

## 2020-12-15 MED ORDER — METHOCARBAMOL 500 MG PO TABS
ORAL_TABLET | ORAL | 2 refills | Status: AC
  Filled 2020-12-15: qty 30, 10d supply, fill #0
  Filled 2021-06-23: qty 30, 10d supply, fill #1

## 2020-12-23 DIAGNOSIS — D500 Iron deficiency anemia secondary to blood loss (chronic): Secondary | ICD-10-CM | POA: Diagnosis not present

## 2020-12-23 DIAGNOSIS — R739 Hyperglycemia, unspecified: Secondary | ICD-10-CM | POA: Diagnosis not present

## 2020-12-23 DIAGNOSIS — E538 Deficiency of other specified B group vitamins: Secondary | ICD-10-CM | POA: Diagnosis not present

## 2020-12-23 DIAGNOSIS — E785 Hyperlipidemia, unspecified: Secondary | ICD-10-CM | POA: Diagnosis not present

## 2020-12-29 ENCOUNTER — Other Ambulatory Visit: Payer: Self-pay

## 2020-12-29 DIAGNOSIS — E669 Obesity, unspecified: Secondary | ICD-10-CM | POA: Diagnosis not present

## 2020-12-29 MED ORDER — PHENTERMINE HCL 37.5 MG PO TABS
ORAL_TABLET | ORAL | 0 refills | Status: DC
  Filled 2020-12-29: qty 30, 30d supply, fill #0

## 2020-12-30 ENCOUNTER — Other Ambulatory Visit: Payer: Self-pay

## 2020-12-30 DIAGNOSIS — D500 Iron deficiency anemia secondary to blood loss (chronic): Secondary | ICD-10-CM | POA: Diagnosis not present

## 2020-12-30 DIAGNOSIS — Z23 Encounter for immunization: Secondary | ICD-10-CM | POA: Diagnosis not present

## 2020-12-30 DIAGNOSIS — N200 Calculus of kidney: Secondary | ICD-10-CM | POA: Diagnosis not present

## 2020-12-30 DIAGNOSIS — E538 Deficiency of other specified B group vitamins: Secondary | ICD-10-CM | POA: Diagnosis not present

## 2020-12-30 DIAGNOSIS — Z0000 Encounter for general adult medical examination without abnormal findings: Secondary | ICD-10-CM | POA: Diagnosis not present

## 2020-12-30 MED ORDER — CYANOCOBALAMIN 1000 MCG/ML IJ SOLN
INTRAMUSCULAR | 5 refills | Status: AC
  Filled 2020-12-30: qty 3, 90d supply, fill #0
  Filled 2021-06-23: qty 3, 90d supply, fill #1

## 2021-01-30 ENCOUNTER — Other Ambulatory Visit: Payer: Self-pay

## 2021-01-30 MED ORDER — PHENTERMINE HCL 37.5 MG PO TABS
ORAL_TABLET | ORAL | 0 refills | Status: DC
  Filled 2021-01-30: qty 30, 30d supply, fill #0

## 2021-02-27 DIAGNOSIS — E663 Overweight: Secondary | ICD-10-CM | POA: Diagnosis not present

## 2021-02-28 ENCOUNTER — Other Ambulatory Visit: Payer: Self-pay

## 2021-02-28 MED ORDER — PHENTERMINE HCL 37.5 MG PO TABS
ORAL_TABLET | ORAL | 0 refills | Status: DC
  Filled 2021-02-28: qty 30, 30d supply, fill #0

## 2021-02-28 MED ORDER — CYANOCOBALAMIN 1000 MCG/ML IJ SOLN
INTRAMUSCULAR | 3 refills | Status: AC
  Filled 2021-02-28: qty 1, 30d supply, fill #0
  Filled 2021-03-15: qty 3, 90d supply, fill #0

## 2021-03-15 ENCOUNTER — Other Ambulatory Visit: Payer: Self-pay

## 2021-04-03 ENCOUNTER — Other Ambulatory Visit: Payer: Self-pay

## 2021-04-03 MED ORDER — PHENTERMINE HCL 37.5 MG PO TABS
ORAL_TABLET | ORAL | 0 refills | Status: DC
  Filled 2021-04-03: qty 30, 30d supply, fill #0

## 2021-04-11 DIAGNOSIS — E663 Overweight: Secondary | ICD-10-CM | POA: Diagnosis not present

## 2021-05-02 ENCOUNTER — Other Ambulatory Visit: Payer: Self-pay

## 2021-05-02 MED ORDER — PHENTERMINE HCL 37.5 MG PO TABS
ORAL_TABLET | ORAL | 0 refills | Status: DC
  Filled 2021-05-02: qty 30, 30d supply, fill #0

## 2021-05-09 DIAGNOSIS — E663 Overweight: Secondary | ICD-10-CM | POA: Diagnosis not present

## 2021-06-05 ENCOUNTER — Other Ambulatory Visit: Payer: Self-pay

## 2021-06-06 ENCOUNTER — Other Ambulatory Visit: Payer: Self-pay

## 2021-06-06 MED ORDER — PHENTERMINE HCL 37.5 MG PO TABS
ORAL_TABLET | ORAL | 0 refills | Status: DC
  Filled 2021-06-06: qty 30, 30d supply, fill #0

## 2021-06-23 ENCOUNTER — Other Ambulatory Visit: Payer: Self-pay

## 2021-06-26 ENCOUNTER — Other Ambulatory Visit: Payer: Self-pay

## 2021-06-27 ENCOUNTER — Other Ambulatory Visit: Payer: Self-pay

## 2021-06-27 MED ORDER — METFORMIN HCL ER 500 MG PO TB24
ORAL_TABLET | ORAL | 1 refills | Status: AC
  Filled 2021-06-27: qty 90, 90d supply, fill #0

## 2021-06-28 ENCOUNTER — Other Ambulatory Visit: Payer: Self-pay

## 2021-06-28 MED ORDER — PHENTERMINE HCL 37.5 MG PO TABS
ORAL_TABLET | ORAL | 0 refills | Status: DC
  Filled 2021-07-04: qty 30, 30d supply, fill #0

## 2021-06-30 DIAGNOSIS — E538 Deficiency of other specified B group vitamins: Secondary | ICD-10-CM | POA: Diagnosis not present

## 2021-06-30 DIAGNOSIS — D500 Iron deficiency anemia secondary to blood loss (chronic): Secondary | ICD-10-CM | POA: Diagnosis not present

## 2021-06-30 DIAGNOSIS — R739 Hyperglycemia, unspecified: Secondary | ICD-10-CM | POA: Diagnosis not present

## 2021-07-05 ENCOUNTER — Other Ambulatory Visit: Payer: Self-pay

## 2021-07-05 DIAGNOSIS — E663 Overweight: Secondary | ICD-10-CM | POA: Diagnosis not present

## 2021-07-05 MED ORDER — PHENTERMINE HCL 37.5 MG PO TABS
ORAL_TABLET | ORAL | 0 refills | Status: DC
  Filled 2021-07-05: qty 30, 30d supply, fill #0

## 2021-07-27 ENCOUNTER — Other Ambulatory Visit: Payer: Self-pay

## 2021-07-27 MED ORDER — CHLORHEXIDINE GLUCONATE 0.12 % MT SOLN
OROMUCOSAL | 0 refills | Status: AC
  Filled 2021-07-27: qty 473, 15d supply, fill #0

## 2021-08-07 ENCOUNTER — Other Ambulatory Visit: Payer: Self-pay

## 2021-08-07 MED ORDER — PHENTERMINE HCL 37.5 MG PO TABS
ORAL_TABLET | ORAL | 0 refills | Status: DC
  Filled 2021-08-07: qty 30, 30d supply, fill #0

## 2021-09-05 ENCOUNTER — Other Ambulatory Visit: Payer: Self-pay

## 2021-09-05 DIAGNOSIS — E663 Overweight: Secondary | ICD-10-CM | POA: Diagnosis not present

## 2021-09-05 MED ORDER — PHENTERMINE HCL 37.5 MG PO TABS
ORAL_TABLET | ORAL | 0 refills | Status: DC
  Filled 2021-09-05: qty 30, 30d supply, fill #0

## 2021-10-17 ENCOUNTER — Other Ambulatory Visit: Payer: Self-pay

## 2021-10-17 MED ORDER — PHENTERMINE HCL 37.5 MG PO TABS
ORAL_TABLET | ORAL | 0 refills | Status: DC
  Filled 2021-10-17: qty 30, 30d supply, fill #0

## 2021-11-20 ENCOUNTER — Other Ambulatory Visit: Payer: Self-pay

## 2021-11-20 MED ORDER — PHENTERMINE HCL 37.5 MG PO TABS
ORAL_TABLET | ORAL | 0 refills | Status: DC
  Filled 2021-11-20: qty 30, 30d supply, fill #0

## 2021-12-11 ENCOUNTER — Other Ambulatory Visit: Payer: Self-pay

## 2021-12-11 DIAGNOSIS — E663 Overweight: Secondary | ICD-10-CM | POA: Diagnosis not present

## 2021-12-11 MED ORDER — PHENTERMINE HCL 37.5 MG PO TABS: ORAL_TABLET | ORAL | 0 refills | Status: AC

## 2021-12-13 ENCOUNTER — Other Ambulatory Visit: Payer: Self-pay

## 2021-12-13 DIAGNOSIS — M25562 Pain in left knee: Secondary | ICD-10-CM | POA: Diagnosis not present

## 2021-12-13 DIAGNOSIS — M1712 Unilateral primary osteoarthritis, left knee: Secondary | ICD-10-CM | POA: Diagnosis not present

## 2021-12-13 MED ORDER — CELECOXIB 200 MG PO CAPS
ORAL_CAPSULE | ORAL | 0 refills | Status: AC
  Filled 2021-12-13: qty 60, 30d supply, fill #0

## 2021-12-14 ENCOUNTER — Other Ambulatory Visit: Payer: Self-pay

## 2021-12-20 ENCOUNTER — Other Ambulatory Visit: Payer: Self-pay

## 2021-12-20 MED ORDER — PHENTERMINE HCL 37.5 MG PO TABS
37.5000 mg | ORAL_TABLET | Freq: Every morning | ORAL | 0 refills | Status: AC
  Filled 2021-12-20: qty 30, 30d supply, fill #0

## 2022-01-05 DIAGNOSIS — E538 Deficiency of other specified B group vitamins: Secondary | ICD-10-CM | POA: Diagnosis not present

## 2022-01-05 DIAGNOSIS — D500 Iron deficiency anemia secondary to blood loss (chronic): Secondary | ICD-10-CM | POA: Diagnosis not present

## 2022-01-05 DIAGNOSIS — R739 Hyperglycemia, unspecified: Secondary | ICD-10-CM | POA: Diagnosis not present

## 2022-01-05 DIAGNOSIS — Z0000 Encounter for general adult medical examination without abnormal findings: Secondary | ICD-10-CM | POA: Diagnosis not present

## 2022-01-11 ENCOUNTER — Other Ambulatory Visit: Payer: Self-pay | Admitting: Internal Medicine

## 2022-01-11 DIAGNOSIS — Z1231 Encounter for screening mammogram for malignant neoplasm of breast: Secondary | ICD-10-CM

## 2022-01-12 ENCOUNTER — Other Ambulatory Visit: Payer: Self-pay

## 2022-01-12 ENCOUNTER — Ambulatory Visit
Admission: RE | Admit: 2022-01-12 | Discharge: 2022-01-12 | Disposition: A | Discharge status: Home / Self Care | Payer: 59 | Source: Ambulatory Visit | Attending: Internal Medicine | Admitting: Internal Medicine

## 2022-01-12 DIAGNOSIS — N200 Calculus of kidney: Secondary | ICD-10-CM | POA: Diagnosis not present

## 2022-01-12 DIAGNOSIS — E663 Overweight: Secondary | ICD-10-CM | POA: Diagnosis not present

## 2022-01-12 DIAGNOSIS — E785 Hyperlipidemia, unspecified: Secondary | ICD-10-CM | POA: Diagnosis not present

## 2022-01-12 DIAGNOSIS — Z1231 Encounter for screening mammogram for malignant neoplasm of breast: Secondary | ICD-10-CM | POA: Diagnosis not present

## 2022-01-12 DIAGNOSIS — Z0000 Encounter for general adult medical examination without abnormal findings: Secondary | ICD-10-CM | POA: Diagnosis not present

## 2022-01-12 DIAGNOSIS — D500 Iron deficiency anemia secondary to blood loss (chronic): Secondary | ICD-10-CM | POA: Diagnosis not present

## 2022-01-12 DIAGNOSIS — E538 Deficiency of other specified B group vitamins: Secondary | ICD-10-CM | POA: Diagnosis not present

## 2022-01-12 DIAGNOSIS — R7303 Prediabetes: Secondary | ICD-10-CM | POA: Diagnosis not present

## 2022-01-12 MED ORDER — ESTRADIOL 0.1 MG/GM VA CREA
TOPICAL_CREAM | VAGINAL | 11 refills | Status: AC
  Filled 2022-01-12: qty 42.5, 90d supply, fill #0

## 2022-01-12 MED ORDER — PHENTERMINE HCL 37.5 MG PO TABS
37.5000 mg | ORAL_TABLET | Freq: Every morning | ORAL | 0 refills | Status: AC
  Filled 2022-02-01: qty 30, 30d supply, fill #0

## 2022-01-15 ENCOUNTER — Encounter: Payer: Self-pay | Admitting: Anesthesiology

## 2022-01-15 ENCOUNTER — Ambulatory Visit: Payer: 59 | Attending: Anesthesiology | Admitting: Anesthesiology

## 2022-01-15 ENCOUNTER — Other Ambulatory Visit: Payer: Self-pay

## 2022-01-15 ENCOUNTER — Other Ambulatory Visit: Payer: Self-pay | Admitting: Anesthesiology

## 2022-01-15 ENCOUNTER — Ambulatory Visit: Admission: RE | Admit: 2022-01-15 | Payer: 59 | Source: Ambulatory Visit

## 2022-01-15 VITALS — BP 121/73 | HR 105 | Temp 97.2°F | Resp 18 | Ht 64.0 in | Wt 155.0 lb

## 2022-01-15 DIAGNOSIS — M5459 Other low back pain: Secondary | ICD-10-CM

## 2022-01-15 DIAGNOSIS — M1711 Unilateral primary osteoarthritis, right knee: Secondary | ICD-10-CM | POA: Diagnosis not present

## 2022-01-15 DIAGNOSIS — G894 Chronic pain syndrome: Secondary | ICD-10-CM

## 2022-01-15 DIAGNOSIS — M791 Myalgia, unspecified site: Secondary | ICD-10-CM | POA: Diagnosis not present

## 2022-01-15 DIAGNOSIS — M5136 Other intervertebral disc degeneration, lumbar region: Secondary | ICD-10-CM | POA: Insufficient documentation

## 2022-01-15 DIAGNOSIS — M545 Low back pain, unspecified: Secondary | ICD-10-CM

## 2022-01-15 MED ORDER — ROPIVACAINE HCL 2 MG/ML IJ SOLN
10.0000 mL | Freq: Once | INTRAMUSCULAR | Status: AC
  Administered 2022-01-15: 9 mL via EPIDURAL
  Filled 2022-01-15: qty 20

## 2022-01-15 MED ORDER — METHOCARBAMOL 750 MG PO TABS
750.0000 mg | ORAL_TABLET | Freq: Three times a day (TID) | ORAL | 1 refills | Status: AC | PRN
  Filled 2022-01-15: qty 90, 30d supply, fill #0

## 2022-01-15 MED ORDER — DEXAMETHASONE SODIUM PHOSPHATE 10 MG/ML IJ SOLN
10.0000 mg | Freq: Once | INTRAMUSCULAR | Status: AC
  Administered 2022-01-15: 10 mg
  Filled 2022-01-15: qty 1

## 2022-01-15 NOTE — Patient Instructions (Signed)

## 2022-01-18 ENCOUNTER — Encounter: Payer: Self-pay | Admitting: Anesthesiology

## 2022-01-18 DIAGNOSIS — M5136 Other intervertebral disc degeneration, lumbar region: Secondary | ICD-10-CM | POA: Insufficient documentation

## 2022-01-18 DIAGNOSIS — M5459 Other low back pain: Secondary | ICD-10-CM | POA: Insufficient documentation

## 2022-01-18 DIAGNOSIS — M51369 Other intervertebral disc degeneration, lumbar region without mention of lumbar back pain or lower extremity pain: Secondary | ICD-10-CM | POA: Insufficient documentation

## 2022-01-18 DIAGNOSIS — M791 Myalgia, unspecified site: Secondary | ICD-10-CM | POA: Insufficient documentation

## 2022-01-18 NOTE — Progress Notes (Signed)
Subjective:  Patient ID: Amanda Solomon, female    DOB: [birth year]  Age: 62 y.o. MRN: [id number]  CC: Hip Pain (left)   Procedure: Trigger point injection x 2 to the left gluteal region  HPI Amanda Solomon presents for reevaluation.  She is reporting incapacitating pain that is in the left posterior gluteal region.  This started after some activity yesterday.  She tried to do some stretching and this only exacerbated the pain.  This has been a recurrent problem for her and this is a acute on chronic exacerbation as reported today.  It is a gnawing aching pain that basically can take her to her knees.  Despite efforts at anti-inflammatories and other conservative pharmacologic regimen she is unable to get any relief.  She has tried heat and ice without significant improvement.  No problems with bowel or bladder function or lower extremity strength or function are noted.  She feels that she cannot get comfortable.  In the past she has had trigger point injections for this and they have been effective in helping her turn the corner for an expedited recovery as reported.  Outpatient Medications Prior to Visit  Medication Sig Dispense Refill   celecoxib (CELEBREX) 200 MG capsule Take 1 capsule (200 mg total) by mouth 2 (two) times daily 60 capsule 0   cyanocobalamin (,VITAMIN B-12,) 1000 MCG/ML injection Inject 1 mL (1,000 mcg total) into the muscle monthly 10 mL 5   Diclofenac Sodium 3 % GEL Place 1 application onto the skin every 12 (twelve) hours as needed. 50 g 0   estradiol (ESTRACE) 0.1 MG/GM vaginal cream Place 1 g vaginally twice a week 42.5 g 11   meloxicam (MOBIC) 15 MG tablet Take 1 tablet (15 mg total) by mouth once daily for 30 days 30 tablet 1   metFORMIN (GLUCOPHAGE-XR) 500 MG 24 hr tablet TAKE 1 TABLET BY MOUTH DAILY WITH DINNER 90 tablet 1   methocarbamol (ROBAXIN) 500 MG tablet Take 1 tablet (500 mg total) by mouth 3 (three) times daily as needed 30 tablet 2   phentermine  (ADIPEX-P) 37.5 MG tablet Take 1 tablet (37.5 mg total) by mouth every morning before breakfast for 30 days 30 tablet 0   albuterol (PROVENTIL HFA;VENTOLIN HFA) 108 (90 Base) MCG/ACT inhaler Inhale 2 puffs into the lungs every 4 (four) hours as needed. (Patient not taking: Reported on 01/15/2022) 1 Inhaler 0   azelastine (ASTELIN) 0.1 % nasal spray PLACE 1 SPRAY INTO BOTH NOSTRILS TWO TIMES DAILY AS NEEDED FOR RHINITIS 30 mL 12   brompheniramine-pseudoephedrine-DM 30-2-10 MG/5ML syrup Take 5 mLs by mouth 4 (four) times daily as needed. (Patient not taking: Reported on 01/15/2022) 120 mL 0   chlorhexidine (PERIDEX) 0.12 % solution after brushing, rinse with one capful for one minute twice a day then spit out (Patient not taking: Reported on 01/15/2022) 473 mL 0   chlorhexidine (PERIDEX) 0.12 % solution After brushing, rinse with one capful for one minute twice a day then spit out (Patient not taking: Reported on 01/15/2022) 473 mL 0   cyanocobalamin (,VITAMIN B-12,) 1000 MCG/ML injection Inject 1 mL (1,000 mcg total) into the muscle monthly (Patient not taking: Reported on 01/15/2022) 1 mL 3   Cyanocobalamin (VITAMIN B 12 PO) Take by mouth. (Patient not taking: Reported on 01/15/2022)     ferrous sulfate 325 (65 FE) MG EC tablet Take 325 mg by mouth 3 (three) times daily with meals. (Patient not taking: Reported on 01/15/2022)  metFORMIN (GLUCOPHAGE-XR) 500 MG 24 hr tablet TAKE 1 TABLET BY MOUTH DAILY WITH DINNER 30 tablet 11   omeprazole (PRILOSEC) 40 MG capsule Take 40 mg by mouth daily. (Patient not taking: Reported on 01/15/2022)     phentermine (ADIPEX-P) 37.5 MG tablet Take 1 tablet (37.5 mg total) by mouth daily in the morning before breakfast (Patient not taking: Reported on 01/15/2022) 30 tablet 0   phentermine (ADIPEX-P) 37.5 MG tablet Take 1 tablet (37.5 mg total) by mouth in the morning before breakfast. (Patient not taking: Reported on 01/15/2022) 30 tablet 0   testosterone cypionate  (DEPOTESTOSTERONE CYPIONATE) 200 MG/ML injection  (Patient not taking: Reported on 01/15/2022)  0   tiZANidine (ZANAFLEX) 4 MG capsule Take 1 capsule (4 mg total) by mouth 3 (three) times daily. (Patient not taking: Reported on 01/15/2022) 30 capsule 0   No facility-administered medications prior to visit.    Review of Systems CNS: No confusion or sedation Cardiac: No angina or palpitations GI: No abdominal pain or constipation Constitutional: No nausea vomiting fevers or chills  Objective:  BP 121/73   Pulse (!) 105   Temp (!) 97.2 F (36.2 C) (Temporal)   Resp 18   Ht 5\' 4"  (1.626 m)   Wt 155 lb (70.3 kg)   LMP 11/03/2014   SpO2 100%   BMI 26.61 kg/m    BP Readings from Last 3 Encounters:  01/15/22 121/73  12/30/17 130/90  10/14/17 127/61     Wt Readings from Last 3 Encounters:  01/15/22 155 lb (70.3 kg)  12/30/17 203 lb (92.1 kg)  10/14/17 197 lb (89.4 kg)     Physical Exam Pt is alert and oriented PERRL EOMI HEART IS RRR no murmur or rub LCTA no wheezing or rales MUSCULOSKELETAL reveals 2 trigger points in the left gluteal region just below the posterior superior iliac crest.  This is below the left SI joint.  He is trigger points are reproducible.  No lower extremity weakness is noted she has an antalgic gait.  Muscle tone and bulk is good.  Labs  No results found for: "HGBA1C" Lab Results  Component Value Date   CREATININE 1.28 (H) 10/24/2016    -------------------------------------------------------------------------------------------------------------------- Lab Results  Component Value Date   WBC 9.7 10/24/2016   HGB 13.6 10/24/2016   HCT 41.5 10/24/2016   PLT 269 10/24/2016   GLUCOSE 113 (H) 10/24/2016   ALT 21 09/20/2014   AST 23 09/20/2014   NA 138 10/24/2016   K 4.3 10/24/2016   CL 105 10/24/2016   CREATININE 1.28 (H) 10/24/2016   BUN 12 10/24/2016   CO2 25 10/24/2016     --------------------------------------------------------------------------------------------------------------------- MM 3D SCREEN BREAST BILATERAL  Result Date: 01/15/2022 CLINICAL DATA:  Screening. EXAM: DIGITAL SCREENING BILATERAL MAMMOGRAM WITH TOMOSYNTHESIS AND CAD TECHNIQUE: Bilateral screening digital craniocaudal and mediolateral oblique mammograms were obtained. Bilateral screening digital breast tomosynthesis was performed. The images were evaluated with computer-aided detection. COMPARISON:  Previous exam(s). ACR Breast Density Category c: The breast tissue is heterogeneously dense, which may obscure small masses. FINDINGS: There are no findings suspicious for malignancy. IMPRESSION: No mammographic evidence of malignancy. A result letter of this screening mammogram will be mailed directly to the patient. RECOMMENDATION: Screening mammogram in one year. (Code:SM-B-01Y) BI-RADS CATEGORY  1: Negative. Electronically Signed   By: Nolon Nations M.D.   On: 01/15/2022 15:50     Assessment & Plan:   There are no diagnoses linked to this encounter.      ----------------------------------------------------------------------------------------------------------------------  Problem List Items Addressed This Visit   None     ----------------------------------------------------------------------------------------------------------------------  There are no diagnoses linked to this encounter.   ----------------------------------------------------------------------------------------------------------------------  I am having Pateros A. Files start on methocarbamol. I am also having her maintain her omeprazole, ferrous sulfate, Cyanocobalamin (VITAMIN B 12 PO), tiZANidine, Diclofenac Sodium, testosterone cypionate, albuterol, brompheniramine-pseudoephedrine-DM, meloxicam, chlorhexidine, methocarbamol, cyanocobalamin, cyanocobalamin, metFORMIN, chlorhexidine, phentermine, celecoxib,  phentermine, azelastine, metFORMIN, estradiol, and phentermine. We administered dexamethasone and ropivacaine (PF) 2 mg/mL (0.2%).   Meds ordered this encounter  Medications   dexamethasone (DECADRON) injection 10 mg   ropivacaine (PF) 2 mg/mL (0.2%) (NAROPIN) injection 10 mL   methocarbamol (ROBAXIN-750) 750 MG tablet    Sig: Take 1 tablet (750 mg total) by mouth every 8 (eight) hours as needed for muscle spasms.    Dispense:  90 tablet    Refill:  1   Patient's Medications  New Prescriptions   METHOCARBAMOL (ROBAXIN-750) 750 MG TABLET    Take 1 tablet (750 mg total) by mouth every 8 (eight) hours as needed for muscle spasms.  Previous Medications   ALBUTEROL (PROVENTIL HFA;VENTOLIN HFA) 108 (90 BASE) MCG/ACT INHALER    Inhale 2 puffs into the lungs every 4 (four) hours as needed.   AZELASTINE (ASTELIN) 0.1 % NASAL SPRAY    PLACE 1 SPRAY INTO BOTH NOSTRILS TWO TIMES DAILY AS NEEDED FOR RHINITIS   BROMPHENIRAMINE-PSEUDOEPHEDRINE-DM 30-2-10 MG/5ML SYRUP    Take 5 mLs by mouth 4 (four) times daily as needed.   CELECOXIB (CELEBREX) 200 MG CAPSULE    Take 1 capsule (200 mg total) by mouth 2 (two) times daily   CHLORHEXIDINE (PERIDEX) 0.12 % SOLUTION    after brushing, rinse with one capful for one minute twice a day then spit out   CHLORHEXIDINE (PERIDEX) 0.12 % SOLUTION    After brushing, rinse with one capful for one minute twice a day then spit out   CYANOCOBALAMIN (,VITAMIN B-12,) 1000 MCG/ML INJECTION    Inject 1 mL (1,000 mcg total) into the muscle monthly   CYANOCOBALAMIN (,VITAMIN B-12,) 1000 MCG/ML INJECTION    Inject 1 mL (1,000 mcg total) into the muscle monthly   CYANOCOBALAMIN (VITAMIN B 12 PO)    Take by mouth.   DICLOFENAC SODIUM 3 % GEL    Place 1 application onto the skin every 12 (twelve) hours as needed.   ESTRADIOL (ESTRACE) 0.1 MG/GM VAGINAL CREAM    Place 1 g vaginally twice a week   FERROUS SULFATE 325 (65 FE) MG EC TABLET    Take 325 mg by mouth 3 (three) times daily  with meals.   MELOXICAM (MOBIC) 15 MG TABLET    Take 1 tablet (15 mg total) by mouth once daily for 30 days   METFORMIN (GLUCOPHAGE-XR) 500 MG 24 HR TABLET    TAKE 1 TABLET BY MOUTH DAILY WITH DINNER   METFORMIN (GLUCOPHAGE-XR) 500 MG 24 HR TABLET    TAKE 1 TABLET BY MOUTH DAILY WITH DINNER   METHOCARBAMOL (ROBAXIN) 500 MG TABLET    Take 1 tablet (500 mg total) by mouth 3 (three) times daily as needed   OMEPRAZOLE (PRILOSEC) 40 MG CAPSULE    Take 40 mg by mouth daily.   PHENTERMINE (ADIPEX-P) 37.5 MG TABLET    Take 1 tablet (37.5 mg total) by mouth every morning before breakfast for 30 days   PHENTERMINE (ADIPEX-P) 37.5 MG TABLET    Take 1 tablet (37.5 mg total) by mouth daily in the morning before  breakfast   PHENTERMINE (ADIPEX-P) 37.5 MG TABLET    Take 1 tablet (37.5 mg total) by mouth in the morning before breakfast.   TESTOSTERONE CYPIONATE (DEPOTESTOSTERONE CYPIONATE) 200 MG/ML INJECTION       TIZANIDINE (ZANAFLEX) 4 MG CAPSULE    Take 1 capsule (4 mg total) by mouth 3 (three) times daily.  Modified Medications   No medications on file  Discontinued Medications   No medications on file   ----------------------------------------------------------------------------------------------------------------------  Follow-up: No follow-ups on file.  1. Primary osteoarthritis of right knee   2. DDD (degenerative disc disease), lumbar   3. Muscular pain   4. Pain of lumbar facet joint   Based on clinical findings I think it be appropriate to proceed with a trigger point injection to the after mentioned site the left gluteal region.  She has had this in the past and responded favorably.  I want her to continue with stretching strengthening exercises and application of ice every 4 hours to assist with pain relief.  She can continue with her current medication management.  Will schedule her for return to clinic in 2 weeks for possible repeat injection if indicated.  Trigger point injection: The area  overlying the aforementioned trigger points were prepped with alcohol. They were then injected with a 25-gauge needle with 4 cc of ropivacaine 0.2% and Decadron 4 mg at each site x 2 left gluteal after negative aspiration for heme. This was performed after informed consent was obtained and risks and benefits reviewed. She tolerated this procedure without difficulty and was convalesced and discharged to home in stable condition for follow-up as mentioned.  @Michaelah Credeur, MD@  Molli Barrows, MD

## 2022-02-01 ENCOUNTER — Other Ambulatory Visit: Payer: Self-pay

## 2022-02-05 ENCOUNTER — Other Ambulatory Visit: Payer: Self-pay

## 2022-02-05 MED ORDER — PHENTERMINE HCL 37.5 MG PO TABS
37.5000 mg | ORAL_TABLET | ORAL | 0 refills | Status: AC
  Filled 2022-03-07: qty 30, 30d supply, fill #0

## 2022-02-07 DIAGNOSIS — E663 Overweight: Secondary | ICD-10-CM | POA: Diagnosis not present

## 2022-03-06 ENCOUNTER — Other Ambulatory Visit: Payer: Self-pay

## 2022-03-07 ENCOUNTER — Other Ambulatory Visit: Payer: Self-pay

## 2022-03-07 MED ORDER — PHENTERMINE HCL 37.5 MG PO TABS: 37.5000 mg | ORAL_TABLET | Freq: Every morning | ORAL | 0 refills | Status: AC

## 2022-04-06 ENCOUNTER — Other Ambulatory Visit: Payer: Self-pay

## 2022-04-06 MED ORDER — PHENTERMINE HCL 37.5 MG PO TABS
37.5000 mg | ORAL_TABLET | ORAL | 0 refills | Status: AC
  Filled 2022-04-06: qty 30, 30d supply, fill #0

## 2022-04-09 ENCOUNTER — Other Ambulatory Visit: Payer: Self-pay

## 2022-04-09 MED ORDER — SCOPOLAMINE 1 MG/3DAYS TD PT72
1.0000 | MEDICATED_PATCH | TRANSDERMAL | 12 refills | Status: AC
  Filled 2022-04-09: qty 8, 24d supply, fill #0
  Filled 2022-10-30: qty 8, 24d supply, fill #1

## 2022-04-09 NOTE — Addendum Note (Signed)
Addended by: Yevette Edwards on: 04/09/2022 11:33 AM   Modules accepted: Orders

## 2022-04-13 ENCOUNTER — Other Ambulatory Visit: Payer: Self-pay

## 2022-04-13 MED ORDER — AZITHROMYCIN 250 MG PO TABS
ORAL_TABLET | ORAL | 0 refills | Status: AC
  Filled 2022-04-13: qty 6, 5d supply, fill #0

## 2022-05-08 ENCOUNTER — Other Ambulatory Visit: Payer: Self-pay

## 2022-05-08 MED ORDER — PHENTERMINE HCL 37.5 MG PO TABS
37.5000 mg | ORAL_TABLET | Freq: Every morning | ORAL | 0 refills | Status: AC
  Filled 2022-05-08: qty 30, 30d supply, fill #0

## 2022-05-14 ENCOUNTER — Other Ambulatory Visit: Payer: Self-pay

## 2022-05-14 DIAGNOSIS — E663 Overweight: Secondary | ICD-10-CM | POA: Diagnosis not present

## 2022-05-14 MED ORDER — PHENTERMINE HCL 37.5 MG PO TABS
37.5000 mg | ORAL_TABLET | Freq: Every morning | ORAL | 0 refills | Status: AC
  Filled 2022-06-07: qty 30, 30d supply, fill #0

## 2022-06-07 ENCOUNTER — Other Ambulatory Visit: Payer: Self-pay

## 2022-06-08 ENCOUNTER — Other Ambulatory Visit: Payer: Self-pay

## 2022-07-10 ENCOUNTER — Other Ambulatory Visit: Payer: Self-pay

## 2022-07-10 DIAGNOSIS — E663 Overweight: Secondary | ICD-10-CM | POA: Diagnosis not present

## 2022-07-10 MED ORDER — PHENTERMINE HCL 37.5 MG PO TABS
37.5000 mg | ORAL_TABLET | Freq: Every morning | ORAL | 0 refills | Status: AC
  Filled 2022-07-10: qty 30, 30d supply, fill #0

## 2022-08-08 ENCOUNTER — Other Ambulatory Visit: Payer: Self-pay

## 2022-08-09 ENCOUNTER — Other Ambulatory Visit: Payer: Self-pay

## 2022-08-10 ENCOUNTER — Other Ambulatory Visit: Payer: Self-pay

## 2022-08-10 MED ORDER — PHENTERMINE HCL 37.5 MG PO TABS
37.5000 mg | ORAL_TABLET | Freq: Every day | ORAL | 0 refills | Status: DC
  Filled 2022-08-10: qty 30, 30d supply, fill #0

## 2022-09-05 ENCOUNTER — Other Ambulatory Visit: Payer: Self-pay

## 2022-09-05 DIAGNOSIS — E663 Overweight: Secondary | ICD-10-CM | POA: Diagnosis not present

## 2022-09-05 MED ORDER — PHENTERMINE HCL 37.5 MG PO TABS
37.5000 mg | ORAL_TABLET | Freq: Every morning | ORAL | 0 refills | Status: DC
  Filled 2022-09-07: qty 30, 30d supply, fill #0

## 2022-09-07 ENCOUNTER — Other Ambulatory Visit: Payer: Self-pay

## 2022-10-30 ENCOUNTER — Other Ambulatory Visit: Payer: Self-pay

## 2022-10-30 MED ORDER — PHENTERMINE HCL 37.5 MG PO TABS
37.5000 mg | ORAL_TABLET | Freq: Every morning | ORAL | 0 refills | Status: DC
  Filled 2022-10-30: qty 30, 30d supply, fill #0

## 2022-11-01 ENCOUNTER — Other Ambulatory Visit: Payer: Self-pay

## 2022-11-26 ENCOUNTER — Other Ambulatory Visit: Payer: Self-pay

## 2022-12-20 ENCOUNTER — Other Ambulatory Visit: Payer: Self-pay

## 2022-12-20 MED ORDER — PHENTERMINE HCL 37.5 MG PO TABS
37.5000 mg | ORAL_TABLET | Freq: Every day | ORAL | 0 refills | Status: DC
  Filled 2022-12-20: qty 30, 30d supply, fill #0

## 2023-02-14 ENCOUNTER — Other Ambulatory Visit: Payer: Self-pay

## 2023-02-14 MED ORDER — PHENTERMINE HCL 37.5 MG PO TABS
37.5000 mg | ORAL_TABLET | ORAL | 0 refills | Status: DC
  Filled 2023-02-14: qty 30, 30d supply, fill #0

## 2023-03-18 ENCOUNTER — Other Ambulatory Visit: Payer: Self-pay

## 2023-03-18 DIAGNOSIS — E663 Overweight: Secondary | ICD-10-CM | POA: Diagnosis not present

## 2023-03-18 MED ORDER — PHENTERMINE HCL 37.5 MG PO TABS
ORAL_TABLET | ORAL | 0 refills | Status: DC
  Filled 2023-03-18: qty 30, 30d supply, fill #0

## 2023-05-15 ENCOUNTER — Other Ambulatory Visit: Payer: Self-pay

## 2023-05-15 MED ORDER — PHENTERMINE HCL 37.5 MG PO TABS
37.5000 mg | ORAL_TABLET | Freq: Every day | ORAL | 0 refills | Status: DC
Start: 2023-05-15 — End: 2023-07-01
  Filled 2023-05-15: qty 30, 30d supply, fill #0

## 2023-07-01 ENCOUNTER — Other Ambulatory Visit: Payer: Self-pay

## 2023-07-01 DIAGNOSIS — E663 Overweight: Secondary | ICD-10-CM | POA: Diagnosis not present

## 2023-07-01 MED ORDER — TRAZODONE HCL 50 MG PO TABS
50.0000 mg | ORAL_TABLET | Freq: Every day | ORAL | 11 refills | Status: AC
  Filled 2023-07-01: qty 30, 30d supply, fill #0

## 2023-07-01 MED ORDER — TRIAMCINOLONE ACETONIDE 0.1 % EX CREA
TOPICAL_CREAM | Freq: Two times a day (BID) | CUTANEOUS | 0 refills | Status: AC
  Filled 2023-07-01: qty 30, 15d supply, fill #0

## 2023-07-01 MED ORDER — PHENTERMINE HCL 37.5 MG PO TABS
37.5000 mg | ORAL_TABLET | Freq: Every day | ORAL | 0 refills | Status: DC
  Filled 2023-07-01: qty 30, 30d supply, fill #0

## 2023-08-08 DIAGNOSIS — D500 Iron deficiency anemia secondary to blood loss (chronic): Secondary | ICD-10-CM | POA: Diagnosis not present

## 2023-08-08 DIAGNOSIS — E785 Hyperlipidemia, unspecified: Secondary | ICD-10-CM | POA: Diagnosis not present

## 2023-08-08 DIAGNOSIS — E538 Deficiency of other specified B group vitamins: Secondary | ICD-10-CM | POA: Diagnosis not present

## 2023-08-08 DIAGNOSIS — R7303 Prediabetes: Secondary | ICD-10-CM | POA: Diagnosis not present

## 2023-08-14 ENCOUNTER — Other Ambulatory Visit: Payer: Self-pay

## 2023-08-14 DIAGNOSIS — R7303 Prediabetes: Secondary | ICD-10-CM | POA: Diagnosis not present

## 2023-08-14 DIAGNOSIS — N200 Calculus of kidney: Secondary | ICD-10-CM | POA: Diagnosis not present

## 2023-08-14 DIAGNOSIS — M1991 Primary osteoarthritis, unspecified site: Secondary | ICD-10-CM | POA: Diagnosis not present

## 2023-08-14 DIAGNOSIS — Z1331 Encounter for screening for depression: Secondary | ICD-10-CM | POA: Diagnosis not present

## 2023-08-14 DIAGNOSIS — E785 Hyperlipidemia, unspecified: Secondary | ICD-10-CM | POA: Diagnosis not present

## 2023-08-14 DIAGNOSIS — Z0000 Encounter for general adult medical examination without abnormal findings: Secondary | ICD-10-CM | POA: Diagnosis not present

## 2023-08-14 DIAGNOSIS — Z8711 Personal history of peptic ulcer disease: Secondary | ICD-10-CM | POA: Diagnosis not present

## 2023-08-14 DIAGNOSIS — D500 Iron deficiency anemia secondary to blood loss (chronic): Secondary | ICD-10-CM | POA: Diagnosis not present

## 2023-08-14 DIAGNOSIS — E538 Deficiency of other specified B group vitamins: Secondary | ICD-10-CM | POA: Diagnosis not present

## 2023-08-14 MED ORDER — ESCITALOPRAM OXALATE 5 MG PO TABS
5.0000 mg | ORAL_TABLET | Freq: Every day | ORAL | 11 refills | Status: AC
  Filled 2023-08-14: qty 90, 90d supply, fill #0

## 2023-09-24 ENCOUNTER — Other Ambulatory Visit: Payer: Self-pay

## 2023-09-24 MED ORDER — PHENTERMINE HCL 37.5 MG PO TABS
37.5000 mg | ORAL_TABLET | Freq: Every morning | ORAL | 0 refills | Status: DC
  Filled 2023-09-24: qty 30, 30d supply, fill #0

## 2023-10-08 ENCOUNTER — Other Ambulatory Visit: Payer: Self-pay

## 2023-10-08 DIAGNOSIS — M1812 Unilateral primary osteoarthritis of first carpometacarpal joint, left hand: Secondary | ICD-10-CM | POA: Diagnosis not present

## 2023-10-08 DIAGNOSIS — M79645 Pain in left finger(s): Secondary | ICD-10-CM | POA: Diagnosis not present

## 2023-10-08 MED ORDER — CELECOXIB 200 MG PO CAPS
200.0000 mg | ORAL_CAPSULE | Freq: Two times a day (BID) | ORAL | 0 refills | Status: AC
  Filled 2023-10-08: qty 60, 30d supply, fill #0

## 2023-12-23 ENCOUNTER — Other Ambulatory Visit: Payer: Self-pay

## 2023-12-23 MED ORDER — PHENTERMINE HCL 37.5 MG PO TABS
37.5000 mg | ORAL_TABLET | Freq: Every day | ORAL | 0 refills | Status: AC
  Filled 2023-12-23: qty 30, 30d supply, fill #0
